# Patient Record
Sex: Female | Born: 1945 | Race: White | Hispanic: No | Smoking: Current every day smoker
Health system: Southern US, Community
[De-identification: ages and names within clinical notes are randomized; demographics above are authoritative.]

## PROBLEM LIST (undated history)

## (undated) DIAGNOSIS — E785 Hyperlipidemia, unspecified: Secondary | ICD-10-CM

## (undated) DIAGNOSIS — I10 Essential (primary) hypertension: Secondary | ICD-10-CM

## (undated) DIAGNOSIS — J45909 Unspecified asthma, uncomplicated: Secondary | ICD-10-CM

## (undated) DIAGNOSIS — R911 Solitary pulmonary nodule: Secondary | ICD-10-CM

## (undated) DIAGNOSIS — J439 Emphysema, unspecified: Secondary | ICD-10-CM

## (undated) DIAGNOSIS — R0600 Dyspnea, unspecified: Secondary | ICD-10-CM

## (undated) DIAGNOSIS — J449 Chronic obstructive pulmonary disease, unspecified: Secondary | ICD-10-CM

## (undated) DIAGNOSIS — R059 Cough, unspecified: Secondary | ICD-10-CM

## (undated) DIAGNOSIS — Z923 Personal history of irradiation: Secondary | ICD-10-CM

## (undated) DIAGNOSIS — J302 Other seasonal allergic rhinitis: Secondary | ICD-10-CM

## (undated) DIAGNOSIS — K219 Gastro-esophageal reflux disease without esophagitis: Secondary | ICD-10-CM

## (undated) DIAGNOSIS — L02619 Cutaneous abscess of unspecified foot: Secondary | ICD-10-CM

## (undated) HISTORY — PX: FOOT SURGERY: SHX648

## (undated) HISTORY — PX: ABDOMINAL HYSTERECTOMY: SHX81

---

## 2017-04-11 ENCOUNTER — Encounter: Payer: Self-pay | Admitting: Gastroenterology

## 2017-06-26 ENCOUNTER — Ambulatory Visit: Payer: Self-pay | Admitting: Gastroenterology

## 2017-10-26 ENCOUNTER — Encounter: Payer: Self-pay | Admitting: Nurse Practitioner

## 2018-02-01 ENCOUNTER — Ambulatory Visit: Payer: Self-pay | Admitting: Nurse Practitioner

## 2019-10-28 ENCOUNTER — Telehealth (HOSPITAL_COMMUNITY): Payer: Self-pay | Admitting: Adult Health

## 2019-10-28 NOTE — Telephone Encounter (Signed)
Called and LMOM regarding monoclonal antibody treatment for COVID 19 given to those who are at risk for complications and/or hospitalization of the virus.  Patient meets criteria based on: age  Call back number given: [phone]  My chart message: unable to send  Wilber Bihari, NP

## 2021-02-18 ENCOUNTER — Encounter: Payer: Self-pay | Admitting: Internal Medicine

## 2021-02-18 ENCOUNTER — Telehealth: Payer: Self-pay | Admitting: Pharmacy Technician

## 2021-02-18 ENCOUNTER — Other Ambulatory Visit: Payer: Self-pay

## 2021-02-18 ENCOUNTER — Ambulatory Visit (INDEPENDENT_AMBULATORY_CARE_PROVIDER_SITE_OTHER): Payer: Medicare Other | Admitting: Internal Medicine

## 2021-02-18 VITALS — BP 136/78 | HR 74 | Temp 98.2°F | Ht 60.0 in | Wt 146.0 lb

## 2021-02-18 DIAGNOSIS — J449 Chronic obstructive pulmonary disease, unspecified: Secondary | ICD-10-CM

## 2021-02-18 DIAGNOSIS — F17210 Nicotine dependence, cigarettes, uncomplicated: Secondary | ICD-10-CM

## 2021-02-18 DIAGNOSIS — R918 Other nonspecific abnormal finding of lung field: Secondary | ICD-10-CM

## 2021-02-18 DIAGNOSIS — R911 Solitary pulmonary nodule: Secondary | ICD-10-CM | POA: Insufficient documentation

## 2021-02-18 MED ORDER — BREZTRI AEROSPHERE 160-9-4.8 MCG/ACT IN AERO: 2.0000 | INHALATION_SPRAY | Freq: Two times a day (BID) | RESPIRATORY_TRACT | 0 refills | Status: DC

## 2021-02-18 MED ORDER — BREZTRI AEROSPHERE 160-9-4.8 MCG/ACT IN AERO: INHALATION_SPRAY | RESPIRATORY_TRACT | 11 refills | Status: DC

## 2021-02-18 NOTE — Assessment & Plan Note (Signed)
4-5 min discussion re active cigarette smoking in addition to office E&M  Ask about tobacco use:   ongoing Advise quitting   Discussed in terms of reducing risk of complications from any type of rx for lung ca as well as benefit   in airway disorders based on all the data we have from the multiple national lung health studies (perfomed over decades at millions of dollars in cost)  indicating that smoking cessation, not choice of inhalers or physicians, is the most important aspect of her care.   Assess willingness:  Not completely  committed at this point Assist in quit attempt:  Per PCP when ready Arrange follow up:   Follow up per Primary Care planned

## 2021-02-18 NOTE — H&P (View-Only) (Signed)
Misty Mann, female    DOB: 1945/02/16  MRN: [id number]   Brief patient profile:  27   yowf  active smoker  referred to pulmonary clinic in [place]  02/18/2021 by sister in law  for copd eval       History of Present Illness  02/18/2021  Pulmonary/ 1st office eval/ Nichola Warren / [facility]  Chief Complaint  Patient presents with   Consult    Consult for SOB and cough Dx with COPD and emphysema  Dyspnea:  steps or nl speed walking  = MMRC2 = can't walk a nl pace on a flat grade s sob but does fine slow and flat Cough: at times min dry daytime never bloody mucus  Sleep: flat bed/ 3 pillows  SABA use: sev times   No obvious day to day or daytime variability or assoc excess/ purulent sputum or mucus plugs or hemoptysis or cp or chest tightness, subjective wheeze or overt sinus or hb symptoms.   sleeping without nocturnal  or early am exacerbation  of respiratory  c/o's or need for noct saba. Also denies any obvious fluctuation of symptoms with weather or environmental changes or other aggravating or alleviating factors except as outlined above   No unusual exposure hx or h/o childhood pna/ asthma or knowledge of premature birth.  Current Allergies, Complete Past Medical History, Past Surgical History, Family History, and Social History were reviewed in [employer] record.  ROS  The following are not active complaints unless bolded Hoarseness, sore throat, dysphagia, dental problems, itching, sneezing,  nasal congestion or discharge of excess mucus or purulent secretions, ear ache,   fever, chills, sweats, unintended wt loss or wt gain, classically pleuritic or exertional cp,  orthopnea pnd or arm/hand swelling  or leg swelling, presyncope, palpitations, abdominal pain, anorexia, nausea, vomiting, diarrhea  or change in bowel habits or change in bladder habits, change in stools or change in urine, dysuria, hematuria,  rash, arthralgias, visual complaints, headache,  numbness, weakness or ataxia or problems with walking or coordination,  change in mood or  memory.           No past medical history on file.  Outpatient Medications Prior to Visit  Medication Sig Dispense Refill   ALBUTEROL IN Inhale into the lungs.     buPROPion (WELLBUTRIN XL) 300 MG 24 hr tablet Take 300 mg by mouth daily.     lisinopril (ZESTRIL) 20 MG tablet Take 20 mg by mouth daily.     montelukast (SINGULAIR) 10 MG tablet Take 10 mg by mouth at bedtime.     omeprazole (PRILOSEC) 20 MG capsule Take 20 mg by mouth daily.     QUEtiapine (SEROQUEL XR) 200 MG 24 hr tablet Take 200 mg by mouth at bedtime.     rosuvastatin (CRESTOR) 40 MG tablet Take 40 mg by mouth daily.     No facility-administered medications prior to visit.     Objective:     BP 136/78 (BP Location: Left Arm, Patient Position: Sitting)    Pulse 74    Temp 98.2 F (36.8 C) (Temporal)    Ht 5' (1.524 m)    Wt 146 lb (66.2 kg)    SpO2 96% Comment: ra   BMI 28.51 kg/m   SpO2: 96 % (ra)  Amb wf nad   HEENT : pt wearing mask not removed for exam due to covid -19 concerns.    NECK :  without JVD/Nodes/TM/ nl carotid upstrokes bilaterally  LUNGS: no acc muscle use,  Mod barrel  contour chest wall with bilateral  Distant exp  wheeze and  without cough on insp or exp maneuvers and mod  Hyperresonant  to  percussion bilaterally     CV:  RRR  no s3 or murmur or increase in P2, and no edema   ABD:  soft and nontender with pos mid insp Hoover's  in the supine position. No bruits or organomegaly appreciated, bowel sounds nl  MS:     ext warm without deformities, calf tenderness, cyanosis or clubbing No obvious joint restrictions   SKIN: warm and dry without lesions    NEURO:  alert, approp, nl sensorium with  no motor or cerebellar deficits apparent.             Assessment   COPD GOLD 3  / active smoking  Active smoker  - PFT's  09/2020 Sovah  FEV1  0.88 (48%)  and FVC 1.74 with ratio 51%  -  02/18/2021  After extensive coaching inhaler device,  effectiveness =    75% from a baseline of < 10% so try breztri 2bid  - Labs ordered 02/18/2021  :     alpha one AT phenotype   - 02/18/2021   Walked on RA  x  3  lap(s) =  approx 450  ft  @ moderate pace, stopped due to end of study s sob  with lowest 02 sats 94%     Group D in terms of symptom/risk and laba/lama/ICS  therefore appropriate rx at this point >>>  breztri trial   Advised:  formulary restrictions will be an ongoing challenge for the forseable future and I would be happy to pick an alternative if the pt will first  provide me a list of them -  pt  will need to return here for training for any new device that is required eg dpi vs hfa vs respimat.    In the meantime we can always provide samples so that the patient never runs out of any needed respiratory medications.      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 1st noted on LDCT  09/23/20  RUL  2.2 spiculated / cavitated  > POS PET  - [facility] turned down for excision "unless I would stop smoking first" (pt/fm takeaway message) - Quant GOLD TB 02/18/2021 >>>   - Super D CT 02/18/2021 >>>   I think she would be very high risk whether she stopped smoking or not at this point but will offer navigational bx and RT/ chemo if can't offer surgery once we know for sure what type of ca this is and what the prognosis / response to non -surgical rx looks like   Discussed in detail all the  indications, usual  risks and alternatives  relative to the benefits with patient who agrees to proceed with w/u as outlined.     Each maintenance medication was reviewed in detail including emphasizing most importantly the difference between maintenance and prns and under what circumstances the prns are to be triggered using an action plan format where appropriate.  Total time for H and P, chart review, counseling, reviewing hfa device(s) , directly observing portions of ambulatory 02 saturation study/ and generating  customized AVS unique to this first office visit / same day charting  > 45 min                Cigarette smoker 4-5 min discussion re active cigarette smoking in  addition to office E&M  Ask about tobacco use:   ongoing Advise quitting   Discussed in terms of reducing risk of complications from any type of rx for lung ca as well as benefit   in airway disorders based on all the data we have from the multiple national lung health studies (perfomed over decades at millions of dollars in cost)  indicating that smoking cessation, not choice of inhalers or physicians, is the most important aspect of her care.   Assess willingness:  Not completely  committed at this point Assist in quit attempt:  Per PCP when ready Arrange follow up:   Follow up per Primary Care planned            Christinia Gully, MD 02/18/2021

## 2021-02-18 NOTE — Telephone Encounter (Signed)
Patient Advocate Encounter  Received notification from [facility]) that prior authorization for BREZTRI 160MG  is required.   PA submitted on 2.2.23 Key  [id number] Status is pending   [facility] will continue to follow  Luciano Cutter, CPhT Patient Advocate Phone: [phone] Fax:  [phone]

## 2021-02-18 NOTE — Assessment & Plan Note (Addendum)
1st noted on LDCT  09/23/20  RUL  2.2 spiculated / cavitated  > POS PET  - [facility] turned down for excision "unless I would stop smoking first" (pt/fm takeaway message) - Quant GOLD TB 02/18/2021 >>>   - Super D CT 02/18/2021 >>>   I think she would be very high risk whether she stopped smoking or not at this point but will offer navigational bx and RT/ chemo if can't offer surgery once we know for sure what type of ca this is and what the prognosis / response to non -surgical rx looks like   Discussed in detail all the  indications, usual  risks and alternatives  relative to the benefits with patient who agrees to proceed with w/u as outlined.     Each maintenance medication was reviewed in detail including emphasizing most importantly the difference between maintenance and prns and under what circumstances the prns are to be triggered using an action plan format where appropriate.  Total time for H and P, chart review, counseling, reviewing hfa device(s) , directly observing portions of ambulatory 02 saturation study/ and generating customized AVS unique to this first office visit / same day charting  > 45 min

## 2021-02-18 NOTE — Patient Instructions (Signed)
Plan A = Automatic = Always=   Breztri Take 2 puffs first thing in am and then another 2 puffs about 12 hours later.    Work on inhaler technique:  relax and gently blow all the way out then take a nice smooth full deep breath back in, triggering the inhaler at same time you start breathing in.  Hold for up to 5 seconds if you can. Blow out thru nose. Rinse and gargle with water when done.  If mouth or throat bother you at all,  try brushing teeth/gums/tongue with arm and hammer toothpaste/ make a slurry and gargle and spit out.      Plan B = Backup (to supplement plan A, not to replace it) Only use your albuterol inhaler as a rescue medication to be used if you can't catch your breath by resting or doing a relaxed purse lip breathing pattern.  - The less you use it, the better it will work when you need it. - Ok to use the inhaler up to 2 puffs  every 4 hours if you must but call for appointment if use goes up over your usual need - Don't leave home without it !!  (think of it like the spare tire for your car)    We will schedule a Super CD CT and call to arrange a biopsy.  The key is to stop smoking completely before smoking completely stops you!   Please remember to go to the lab department   for your tests - we will call you with the results when they are available.     Pulmonary follow up can be arranged after your biopsy

## 2021-02-18 NOTE — Progress Notes (Signed)
Misty Mann, female    DOB: 1945/10/28  MRN: [id number]   Brief patient profile:  30   yowf  active smoker  referred to pulmonary clinic in [place]  02/18/2021 by sister in law  for copd eval       History of Present Illness  02/18/2021  Pulmonary/ 1st office eval/ Esvin Hnat / [facility]  Chief Complaint  Patient presents with   Consult    Consult for SOB and cough Dx with COPD and emphysema  Dyspnea:  steps or nl speed walking  = MMRC2 = can't walk a nl pace on a flat grade s sob but does fine slow and flat Cough: at times min dry daytime never bloody mucus  Sleep: flat bed/ 3 pillows  SABA use: sev times   No obvious day to day or daytime variability or assoc excess/ purulent sputum or mucus plugs or hemoptysis or cp or chest tightness, subjective wheeze or overt sinus or hb symptoms.   sleeping without nocturnal  or early am exacerbation  of respiratory  c/o's or need for noct saba. Also denies any obvious fluctuation of symptoms with weather or environmental changes or other aggravating or alleviating factors except as outlined above   No unusual exposure hx or h/o childhood pna/ asthma or knowledge of premature birth.  Current Allergies, Complete Past Medical History, Past Surgical History, Family History, and Social History were reviewed in [employer] record.  ROS  The following are not active complaints unless bolded Hoarseness, sore throat, dysphagia, dental problems, itching, sneezing,  nasal congestion or discharge of excess mucus or purulent secretions, ear ache,   fever, chills, sweats, unintended wt loss or wt gain, classically pleuritic or exertional cp,  orthopnea pnd or arm/hand swelling  or leg swelling, presyncope, palpitations, abdominal pain, anorexia, nausea, vomiting, diarrhea  or change in bowel habits or change in bladder habits, change in stools or change in urine, dysuria, hematuria,  rash, arthralgias, visual complaints, headache,  numbness, weakness or ataxia or problems with walking or coordination,  change in mood or  memory.           No past medical history on file.  Outpatient Medications Prior to Visit  Medication Sig Dispense Refill   ALBUTEROL IN Inhale into the lungs.     buPROPion (WELLBUTRIN XL) 300 MG 24 hr tablet Take 300 mg by mouth daily.     lisinopril (ZESTRIL) 20 MG tablet Take 20 mg by mouth daily.     montelukast (SINGULAIR) 10 MG tablet Take 10 mg by mouth at bedtime.     omeprazole (PRILOSEC) 20 MG capsule Take 20 mg by mouth daily.     QUEtiapine (SEROQUEL XR) 200 MG 24 hr tablet Take 200 mg by mouth at bedtime.     rosuvastatin (CRESTOR) 40 MG tablet Take 40 mg by mouth daily.     No facility-administered medications prior to visit.     Objective:     BP 136/78 (BP Location: Left Arm, Patient Position: Sitting)    Pulse 74    Temp 98.2 F (36.8 C) (Temporal)    Ht 5' (1.524 m)    Wt 146 lb (66.2 kg)    SpO2 96% Comment: ra   BMI 28.51 kg/m   SpO2: 96 % (ra)  Amb wf nad   HEENT : pt wearing mask not removed for exam due to covid -19 concerns.    NECK :  without JVD/Nodes/TM/ nl carotid upstrokes bilaterally  LUNGS: no acc muscle use,  Mod barrel  contour chest wall with bilateral  Distant exp  wheeze and  without cough on insp or exp maneuvers and mod  Hyperresonant  to  percussion bilaterally     CV:  RRR  no s3 or murmur or increase in P2, and no edema   ABD:  soft and nontender with pos mid insp Hoover's  in the supine position. No bruits or organomegaly appreciated, bowel sounds nl  MS:     ext warm without deformities, calf tenderness, cyanosis or clubbing No obvious joint restrictions   SKIN: warm and dry without lesions    NEURO:  alert, approp, nl sensorium with  no motor or cerebellar deficits apparent.             Assessment   COPD GOLD 3  / active smoking  Active smoker  - PFT's  09/2020 Sovah  FEV1  0.88 (48%)  and FVC 1.74 with ratio 51%  -  02/18/2021  After extensive coaching inhaler device,  effectiveness =    75% from a baseline of < 10% so try breztri 2bid  - Labs ordered 02/18/2021  :     alpha one AT phenotype   - 02/18/2021   Walked on RA  x  3  lap(s) =  approx 450  ft  @ moderate pace, stopped due to end of study s sob  with lowest 02 sats 94%     Group D in terms of symptom/risk and laba/lama/ICS  therefore appropriate rx at this point >>>  breztri trial   Advised:  formulary restrictions will be an ongoing challenge for the forseable future and I would be happy to pick an alternative if the pt will first  provide me a list of them -  pt  will need to return here for training for any new device that is required eg dpi vs hfa vs respimat.    In the meantime we can always provide samples so that the patient never runs out of any needed respiratory medications.      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 1st noted on LDCT  09/23/20  RUL  2.2 spiculated / cavitated  > POS PET  - [facility] turned down for excision "unless I would stop smoking first" (pt/fm takeaway message) - Quant GOLD TB 02/18/2021 >>>   - Super D CT 02/18/2021 >>>   I think she would be very high risk whether she stopped smoking or not at this point but will offer navigational bx and RT/ chemo if can't offer surgery once we know for sure what type of ca this is and what the prognosis / response to non -surgical rx looks like   Discussed in detail all the  indications, usual  risks and alternatives  relative to the benefits with patient who agrees to proceed with w/u as outlined.     Each maintenance medication was reviewed in detail including emphasizing most importantly the difference between maintenance and prns and under what circumstances the prns are to be triggered using an action plan format where appropriate.  Total time for H and P, chart review, counseling, reviewing hfa device(s) , directly observing portions of ambulatory 02 saturation study/ and generating  customized AVS unique to this first office visit / same day charting  > 45 min                Cigarette smoker 4-5 min discussion re active cigarette smoking in  addition to office E&M  Ask about tobacco use:   ongoing Advise quitting   Discussed in terms of reducing risk of complications from any type of rx for lung ca as well as benefit   in airway disorders based on all the data we have from the multiple national lung health studies (perfomed over decades at millions of dollars in cost)  indicating that smoking cessation, not choice of inhalers or physicians, is the most important aspect of her care.   Assess willingness:  Not completely  committed at this point Assist in quit attempt:  Per PCP when ready Arrange follow up:   Follow up per Primary Care planned            Christinia Gully, MD 02/18/2021

## 2021-02-18 NOTE — Assessment & Plan Note (Addendum)
Active smoker  - PFT's  09/2020 Sovah  FEV1  0.88 (48%)  and FVC 1.74 with ratio 51%  - 02/18/2021  After extensive coaching inhaler device,  effectiveness =    75% from a baseline of < 10% so try breztri 2bid  - Labs ordered 02/18/2021  :     alpha one AT phenotype   - 02/18/2021   Walked on RA  x  3  lap(s) =  approx 450  ft  @ moderate pace, stopped due to end of study s sob  with lowest 02 sats 94%     Group D in terms of symptom/risk and laba/lama/ICS  therefore appropriate rx at this point >>>  breztri trial   Advised:  formulary restrictions will be an ongoing challenge for the forseable future and I would be happy to pick an alternative if the pt will first  provide me a list of them -  pt  will need to return here for training for any new device that is required eg dpi vs hfa vs respimat.    In the meantime we can always provide samples so that the patient never runs out of any needed respiratory medications.

## 2021-02-19 ENCOUNTER — Other Ambulatory Visit (HOSPITAL_COMMUNITY): Payer: Self-pay

## 2021-02-19 ENCOUNTER — Telehealth: Payer: Self-pay | Admitting: Internal Medicine

## 2021-02-19 NOTE — Telephone Encounter (Signed)
Called and notified pts daughter tracey (ok per dpr) that PA was submitted yesterday. She voiced understanding. Offered a sample for patient and she states she will call if she needs one

## 2021-02-19 NOTE — Telephone Encounter (Signed)
"  Pt's daughter called stating her insurance won't cover Judithann Sauger without a prior [place].  Please advise.  Ph:  [phone], ins # [facility].  [facility] at [address] "  PA papers faxed to [facility] office. Will route to PA team

## 2021-02-23 ENCOUNTER — Telehealth: Payer: PRIVATE HEALTH INSURANCE | Admitting: Internal Medicine

## 2021-02-23 ENCOUNTER — Other Ambulatory Visit (HOSPITAL_COMMUNITY): Payer: Self-pay

## 2021-02-23 LAB — BASIC METABOLIC PANEL
BUN/Creatinine Ratio: 12 (ref 12–28)
BUN: 13 mg/dL (ref 8–27)
CO2: 25 mmol/L (ref 20–29)
Calcium: 9.6 mg/dL (ref 8.7–10.3)
Chloride: 101 mmol/L (ref 96–106)
Creatinine, Ser: 1.12 mg/dL — ABNORMAL HIGH (ref 0.57–1.00)
Glucose: 89 mg/dL (ref 70–99)
Potassium: 4.7 mmol/L (ref 3.5–5.2)
Sodium: 141 mmol/L (ref 134–144)
eGFR: 52 mL/min/{1.73_m2} — ABNORMAL LOW (ref >59)

## 2021-02-23 LAB — CBC WITH DIFFERENTIAL/PLATELET
Basophils Absolute: 0 10*3/uL (ref 0.0–0.2)
Basos: 0 %
EOS (ABSOLUTE): 0.2 10*3/uL (ref 0.0–0.4)
Eos: 3 %
Hematocrit: 42.6 % (ref 34.0–46.6)
Hemoglobin: 14.2 g/dL (ref 11.1–15.9)
Immature Grans (Abs): 0 10*3/uL (ref 0.0–0.1)
Immature Granulocytes: 0 %
Lymphocytes Absolute: 1.4 10*3/uL (ref 0.7–3.1)
Lymphs: 21 %
MCH: 29.5 pg (ref 26.6–33.0)
MCHC: 33.3 g/dL (ref 31.5–35.7)
MCV: 89 fL (ref 79–97)
Monocytes Absolute: 0.5 10*3/uL (ref 0.1–0.9)
Monocytes: 7 %
Neutrophils Absolute: 4.8 10*3/uL (ref 1.4–7.0)
Neutrophils: 69 %
Platelets: 275 10*3/uL (ref 150–450)
RBC: 4.81 x10E6/uL (ref 3.77–5.28)
RDW: 13.3 % (ref 11.7–15.4)
WBC: 6.9 10*3/uL (ref 3.4–10.8)

## 2021-02-23 LAB — QUANTIFERON-TB GOLD PLUS
QuantiFERON Mitogen Value: >10 IU/mL
QuantiFERON Nil Value: 0.02 IU/mL
QuantiFERON TB1 Ag Value: 0.02 IU/mL
QuantiFERON TB2 Ag Value: 0.03 IU/mL
QuantiFERON-TB Gold Plus: NEGATIVE

## 2021-02-23 LAB — ALPHA-1-ANTITRYPSIN PHENOTYP: A-1 Antitrypsin: 150 mg/dL (ref 101–187)

## 2021-02-23 MED ORDER — BREZTRI AEROSPHERE 160-9-4.8 MCG/ACT IN AERO: 2.0000 | INHALATION_SPRAY | Freq: Two times a day (BID) | RESPIRATORY_TRACT | 0 refills | Status: DC

## 2021-02-23 NOTE — Telephone Encounter (Signed)
Patient Advocate Encounter  Received notification from [facility] Medicare Part D that the request for prior authorization for Breztri 160 has been denied due to it being non-formulary.    PA Case ID: [id number]   Misty Mann is denied because it is not on your plan's Drug List (formulary). Medication authorization requires the following: (1) You need to try five (5) of these covered drugs: (a) Anoro Ellipta. (b) Breo Ellipta. (c) Serevent Diskus. (d) Spiriva HandiHaler. (e) Stiolto Respimat. (f) Trelegy Ellipta  Unfortunately all of these are expensive too. The pt has a high deductible to meet.   [facility] Patient Advocate Fax:  [phone]

## 2021-02-23 NOTE — Telephone Encounter (Signed)
Called patient but she did not answer. Left message for her to call back. Will need to speak with her before changing the prescription.

## 2021-02-23 NOTE — Telephone Encounter (Signed)
Called and spoke to tracey (ok per dpr) she would like samples for patient since they have not received an approval yet regarding breztri. Samples placed up front for pick up.   Will route to PA team for an update on PA for breztri. Please advise.  Thanks!

## 2021-02-23 NOTE — Telephone Encounter (Signed)
I have not called patient but did speak to her daugher Linus Orn earlier. Will return patients call and let her know about changes once we hear back from MD regarding new inhaler.

## 2021-02-23 NOTE — Telephone Encounter (Signed)
Try trelegy 103 one click each am

## 2021-02-23 NOTE — Telephone Encounter (Signed)
Pt called and left a vm fo me stating she had a missed call from this # (should be our main #?).  Looks like call from [employer].

## 2021-02-24 ENCOUNTER — Ambulatory Visit (HOSPITAL_COMMUNITY)
Admission: RE | Admit: 2021-02-24 | Discharge: 2021-02-24 | Disposition: A | Discharge status: Home / Self Care | Payer: Medicare Other | Source: Ambulatory Visit | Attending: Internal Medicine | Admitting: Internal Medicine

## 2021-02-24 ENCOUNTER — Other Ambulatory Visit: Payer: Self-pay

## 2021-02-24 DIAGNOSIS — R918 Other nonspecific abnormal finding of lung field: Secondary | ICD-10-CM | POA: Diagnosis present

## 2021-02-24 MED ORDER — TRELEGY ELLIPTA 100-62.5-25 MCG/ACT IN AEPB: 1.0000 | INHALATION_SPRAY | Freq: Every day | RESPIRATORY_TRACT | 11 refills | Status: DC

## 2021-02-24 NOTE — Telephone Encounter (Signed)
I approved trelegy 100 but she will need training on how to use it correctly on click each am

## 2021-02-24 NOTE — Telephone Encounter (Signed)
Spoke with the pt and notified of need for inhaler change. She was okay with the change. Rx sent. MAR updated. Nothing further needed.

## 2021-02-24 NOTE — Addendum Note (Signed)
Addended by: Rosana Berger on: 02/24/2021 09:24 AM   Modules accepted: Orders

## 2021-02-25 ENCOUNTER — Telehealth: Payer: Self-pay | Admitting: Internal Medicine

## 2021-02-25 NOTE — Telephone Encounter (Signed)
This was addressed by Magda Paganini to the patient yesterday in a sep. Encounter. Nothing further needed.

## 2021-02-26 ENCOUNTER — Other Ambulatory Visit: Payer: Self-pay | Admitting: Emergency Medicine

## 2021-02-26 DIAGNOSIS — R911 Solitary pulmonary nodule: Secondary | ICD-10-CM

## 2021-02-26 NOTE — Telephone Encounter (Signed)
Called and spoke with patient's daughter. She stated that the patient had received 2 calls from Dr. Melvyn Novas asking if she wanted a biopsy or surgery for her lung nodule. Then she said another call stated he was trying to get her scheduled for a biopsy on Monday 03/03/21. I advised her that I did not see any discussion in her chart.   Dr. Melvyn Novas, can you please advise? Thanks!

## 2021-02-26 NOTE — Telephone Encounter (Signed)
Documentation is in the result note.  I discussed the case with pt and Dr Lamonte Sakai and now also pt's daughter and all set for navigational tbbx when it can be mutually agreed to be performed.

## 2021-03-01 ENCOUNTER — Telehealth: Payer: Self-pay | Admitting: Emergency Medicine

## 2021-03-01 NOTE — Telephone Encounter (Signed)
I scheduled pt for 2/20 at 9:15 at [facility].  She will arrive at 6:15 to get her covid test that morning prior to procedure.  I spoke to pt & gave her appt info.  I spoke to Harrell at [facility] and she is going to have disc sent to [facility].

## 2021-03-03 ENCOUNTER — Other Ambulatory Visit: Payer: Self-pay

## 2021-03-05 ENCOUNTER — Encounter (HOSPITAL_COMMUNITY): Payer: Self-pay | Admitting: Emergency Medicine

## 2021-03-05 NOTE — Progress Notes (Signed)
Unable to reach patient via phone.  Left detailed message on machine with instructions for DOS.  DUE TO COVID-19 ONLY ONE VISITOR IS ALLOWED TO COME WITH YOU AND STAY IN THE WAITING ROOM ONLY DURING PRE OP AND PROCEDURE DAY OF SURGERY.   PCP -  Cardiologist - n/a Oncology - Dr Felicie Morn, [place] [place].  CT Chest x-ray - 02/24/21 EKG - DOS Stress Test - n/a ECHO - n/a Cardiac Cath - n/a  ICD Pacemaker/Loop - n/a  Sleep Study -  n/a CPAP - none  Anesthesia review: Yes  STOP now taking any Aspirin (unless otherwise instructed by your surgeon), Aleve, Naproxen, Ibuprofen, Motrin, Advil, Goody's, BC's, all herbal medications, fish oil, and all vitamins.   Coronavirus Screening Covid test is scheduled on DOS

## 2021-03-08 ENCOUNTER — Encounter (HOSPITAL_COMMUNITY)
Admission: RE | Disposition: A | Discharge status: Home / Self Care | Payer: Self-pay | Source: Home / Self Care | Attending: Emergency Medicine

## 2021-03-08 ENCOUNTER — Ambulatory Visit (HOSPITAL_BASED_OUTPATIENT_CLINIC_OR_DEPARTMENT_OTHER): Payer: Medicare Other | Admitting: Emergency Medicine

## 2021-03-08 ENCOUNTER — Ambulatory Visit (HOSPITAL_COMMUNITY): Payer: Medicare Other

## 2021-03-08 ENCOUNTER — Ambulatory Visit (HOSPITAL_COMMUNITY)
Admission: RE | Admit: 2021-03-08 | Discharge: 2021-03-08 | Disposition: A | Discharge status: Home / Self Care | Payer: Medicare Other | Attending: Emergency Medicine | Admitting: Emergency Medicine

## 2021-03-08 ENCOUNTER — Other Ambulatory Visit: Payer: Self-pay

## 2021-03-08 ENCOUNTER — Encounter (HOSPITAL_COMMUNITY): Payer: Self-pay | Admitting: Emergency Medicine

## 2021-03-08 ENCOUNTER — Ambulatory Visit (HOSPITAL_COMMUNITY): Payer: Medicare Other | Admitting: Emergency Medicine

## 2021-03-08 DIAGNOSIS — R911 Solitary pulmonary nodule: Secondary | ICD-10-CM | POA: Diagnosis present

## 2021-03-08 DIAGNOSIS — C3411 Malignant neoplasm of upper lobe, right bronchus or lung: Secondary | ICD-10-CM | POA: Diagnosis not present

## 2021-03-08 DIAGNOSIS — I10 Essential (primary) hypertension: Secondary | ICD-10-CM | POA: Insufficient documentation

## 2021-03-08 DIAGNOSIS — J449 Chronic obstructive pulmonary disease, unspecified: Secondary | ICD-10-CM | POA: Insufficient documentation

## 2021-03-08 DIAGNOSIS — F17200 Nicotine dependence, unspecified, uncomplicated: Secondary | ICD-10-CM | POA: Insufficient documentation

## 2021-03-08 DIAGNOSIS — Z20822 Contact with and (suspected) exposure to covid-19: Secondary | ICD-10-CM | POA: Diagnosis not present

## 2021-03-08 DIAGNOSIS — Z9889 Other specified postprocedural states: Secondary | ICD-10-CM

## 2021-03-08 DIAGNOSIS — K219 Gastro-esophageal reflux disease without esophagitis: Secondary | ICD-10-CM

## 2021-03-08 DIAGNOSIS — Z419 Encounter for procedure for purposes other than remedying health state, unspecified: Secondary | ICD-10-CM

## 2021-03-08 HISTORY — DX: Hyperlipidemia, unspecified: E78.5

## 2021-03-08 HISTORY — PX: VIDEO BRONCHOSCOPY WITH RADIAL ENDOBRONCHIAL ULTRASOUND: SHX6849

## 2021-03-08 HISTORY — DX: Other seasonal allergic rhinitis: J30.2

## 2021-03-08 HISTORY — DX: Solitary pulmonary nodule: R91.1

## 2021-03-08 HISTORY — DX: Emphysema, unspecified: J43.9

## 2021-03-08 HISTORY — PX: BRONCHIAL BIOPSY: SHX5109

## 2021-03-08 HISTORY — PX: BRONCHIAL NEEDLE ASPIRATION BIOPSY: SHX5106

## 2021-03-08 HISTORY — DX: Unspecified asthma, uncomplicated: J45.909

## 2021-03-08 HISTORY — DX: Dyspnea, unspecified: R06.00

## 2021-03-08 HISTORY — PX: BRONCHIAL BRUSHINGS: SHX5108

## 2021-03-08 HISTORY — DX: Chronic obstructive pulmonary disease, unspecified: J44.9

## 2021-03-08 HISTORY — PX: FIDUCIAL MARKER PLACEMENT: SHX6858

## 2021-03-08 HISTORY — DX: Gastro-esophageal reflux disease without esophagitis: K21.9

## 2021-03-08 HISTORY — DX: Essential (primary) hypertension: I10

## 2021-03-08 HISTORY — DX: Cough, unspecified: R05.9

## 2021-03-08 LAB — SARS CORONAVIRUS 2 BY RT PCR (HOSPITAL ORDER, PERFORMED IN ~~LOC~~ HOSPITAL LAB): SARS Coronavirus 2: NEGATIVE

## 2021-03-08 SURGERY — BRONCHOSCOPY, WITH BIOPSY USING ELECTROMAGNETIC NAVIGATION
Anesthesia: General

## 2021-03-08 MED ORDER — PROPOFOL 10 MG/ML IV BOLUS
INTRAVENOUS | Status: DC | PRN
  Administered 2021-03-08: 120 mg via INTRAVENOUS

## 2021-03-08 MED ORDER — EPHEDRINE SULFATE-NACL 50-0.9 MG/10ML-% IV SOSY
PREFILLED_SYRINGE | INTRAVENOUS | Status: DC | PRN
  Administered 2021-03-08: 5 mg via INTRAVENOUS

## 2021-03-08 MED ORDER — ONDANSETRON HCL 4 MG/2ML IJ SOLN
INTRAMUSCULAR | Status: DC | PRN
  Administered 2021-03-08: 4 mg via INTRAVENOUS

## 2021-03-08 MED ORDER — ROCURONIUM BROMIDE 10 MG/ML (PF) SYRINGE
PREFILLED_SYRINGE | INTRAVENOUS | Status: DC | PRN
  Administered 2021-03-08: 50 mg via INTRAVENOUS

## 2021-03-08 MED ORDER — CHLORHEXIDINE GLUCONATE 0.12 % MT SOLN
15.0000 mL | Freq: Once | OROMUCOSAL | Status: AC
  Filled 2021-03-08: qty 15

## 2021-03-08 MED ORDER — LACTATED RINGERS IV SOLN: INTRAVENOUS | Status: DC

## 2021-03-08 MED ORDER — SUGAMMADEX SODIUM 200 MG/2ML IV SOLN
INTRAVENOUS | Status: DC | PRN
Start: 2021-03-08 — End: 2021-03-08
  Administered 2021-03-08: 200 mg via INTRAVENOUS

## 2021-03-08 MED ORDER — FENTANYL CITRATE (PF) 100 MCG/2ML IJ SOLN: 25.0000 ug | INTRAMUSCULAR | Status: DC | PRN

## 2021-03-08 MED ORDER — PHENYLEPHRINE HCL-NACL 20-0.9 MG/250ML-% IV SOLN
INTRAVENOUS | Status: DC | PRN
  Administered 2021-03-08: 25 ug/min via INTRAVENOUS

## 2021-03-08 MED ORDER — CHLORHEXIDINE GLUCONATE 0.12 % MT SOLN
OROMUCOSAL | Status: AC
  Administered 2021-03-08: 15 mL via OROMUCOSAL
  Filled 2021-03-08: qty 15

## 2021-03-08 MED ORDER — ACETAMINOPHEN 500 MG PO TABS: 1000.0000 mg | ORAL_TABLET | Freq: Once | ORAL | Status: DC

## 2021-03-08 MED ORDER — ONDANSETRON HCL 4 MG/2ML IJ SOLN: 4.0000 mg | Freq: Once | INTRAMUSCULAR | Status: DC | PRN

## 2021-03-08 MED ORDER — OXYCODONE HCL 5 MG PO TABS: 5.0000 mg | ORAL_TABLET | Freq: Once | ORAL | Status: DC | PRN

## 2021-03-08 MED ORDER — LIDOCAINE 2% (20 MG/ML) 5 ML SYRINGE
INTRAMUSCULAR | Status: DC | PRN
Start: 2021-03-08 — End: 2021-03-08
  Administered 2021-03-08: 60 mg via INTRAVENOUS

## 2021-03-08 MED ORDER — OXYCODONE HCL 5 MG/5ML PO SOLN: 5.0000 mg | Freq: Once | ORAL | Status: DC | PRN

## 2021-03-08 MED ORDER — FENTANYL CITRATE (PF) 100 MCG/2ML IJ SOLN
INTRAMUSCULAR | Status: DC | PRN
  Administered 2021-03-08 (×2): 50 ug via INTRAVENOUS

## 2021-03-08 MED ORDER — DEXAMETHASONE SODIUM PHOSPHATE 10 MG/ML IJ SOLN
INTRAMUSCULAR | Status: DC | PRN
  Administered 2021-03-08: 5 mg via INTRAVENOUS

## 2021-03-08 SURGICAL SUPPLY — 1 items: fiducial ×1 IMPLANT

## 2021-03-08 NOTE — Anesthesia Preprocedure Evaluation (Addendum)
Anesthesia Evaluation  Patient identified by MRN, date of birth, ID band Patient awake    Reviewed: Allergy & Precautions, NPO status , Patient's Chart, lab work & pertinent test results  History of Anesthesia Complications Negative for: history of anesthetic complications  Airway Mallampati: III  TM Distance: >3 FB Neck ROM: Full  Mouth opening: Limited Mouth Opening  Dental  (+) Dental Advisory Given, Edentulous Upper, Partial Lower   Pulmonary asthma , COPD,  COPD inhaler, Current SmokerPatient did not abstain from smoking.,   RUL nodule    Pulmonary exam normal        Cardiovascular hypertension, Pt. on medications Normal cardiovascular exam     Neuro/Psych negative neurological ROS  negative psych ROS   GI/Hepatic Neg liver ROS, GERD  Controlled and Medicated,  Endo/Other  negative endocrine ROS  Renal/GU negative Renal ROS     Musculoskeletal negative musculoskeletal ROS (+)   Abdominal   Peds  Hematology negative hematology ROS (+)   Anesthesia Other Findings   Reproductive/Obstetrics                            Anesthesia Physical Anesthesia Plan  ASA: 3  Anesthesia Plan: General   Post-op Pain Management: Tylenol PO (pre-op)* and Minimal or no pain anticipated   Induction: Intravenous  PONV Risk Score and Plan: 3 and Treatment may vary due to age or medical condition, Ondansetron and Dexamethasone  Airway Management Planned: Oral ETT  Additional Equipment: None  Intra-op Plan:   Post-operative Plan: Extubation in OR  Informed Consent: I have reviewed the patients History and Physical, chart, labs and discussed the procedure including the risks, benefits and alternatives for the proposed anesthesia with the patient or authorized representative who has indicated his/her understanding and acceptance.     Dental advisory given  Plan Discussed with: CRNA and  Anesthesiologist  Anesthesia Plan Comments:        Anesthesia Quick Evaluation

## 2021-03-08 NOTE — Anesthesia Procedure Notes (Signed)
Procedure Name: Intubation Date/Time: 03/08/2021 9:55 AM Performed by: Reece Agar, CRNA Pre-anesthesia Checklist: Patient identified, Emergency Drugs available, Suction available and Patient being monitored Patient Re-evaluated:Patient Re-evaluated prior to induction Oxygen Delivery Method: Circle System Utilized Preoxygenation: Pre-oxygenation with 100% oxygen Induction Type: IV induction Ventilation: Mask ventilation without difficulty Laryngoscope Size: Mac and 3 Grade View: Grade I Tube type: Oral Tube size: 8.5 mm Number of attempts: 1 Airway Equipment and Method: Stylet and Oral airway Placement Confirmation: ETT inserted through vocal cords under direct vision, positive ETCO2 and breath sounds checked- equal and bilateral Secured at: 20 cm Tube secured with: Tape Dental Injury: Teeth and Oropharynx as per pre-operative assessment

## 2021-03-08 NOTE — Op Note (Signed)
Video Bronchoscopy with Robotic Assisted Bronchoscopic Navigation   Date of Operation: 03/08/2021   Pre-op Diagnosis: Right upper lobe nodule  Post-op Diagnosis: Same  Surgeon: Baltazar Apo  Assistants: None  Anesthesia: General endotracheal anesthesia  Operation: Flexible video fiberoptic bronchoscopy with robotic assistance and biopsies.  Estimated Blood Loss: Minimal  Complications: None  Indications and History: Misty Mann is a 76 y.o. female with history of tobacco use.  She was found to have a 2.6 cm right upper lobe pulmonary nodule on CT scan of the chest.  Recommendation made to achieve a tissue diagnosis via navigational bronchoscopy with biopsies. The risks, benefits, complications, treatment options and expected outcomes were discussed with the patient.  The possibilities of pneumothorax, pneumonia, reaction to medication, pulmonary aspiration, perforation of a viscus, bleeding, failure to diagnose a condition and creating a complication requiring transfusion or operation were discussed with the patient who freely signed the consent.    Description of Procedure: The patient was seen in the Preoperative Area, was examined and was deemed appropriate to proceed.  The patient was taken to [facility] endoscopy room 3, identified as Misty Mann and the procedure verified as Flexible Video Fiberoptic Bronchoscopy.  A Time Out was held and the above information confirmed.   Prior to the date of the procedure a high-resolution CT scan of the chest was performed. Utilizing ION software program a virtual tracheobronchial tree was generated to allow the creation of distinct navigation pathways to the patient's parenchymal abnormalities. After being taken to the operating room general anesthesia was initiated and the patient  was orally intubated. The video fiberoptic bronchoscope was introduced via the endotracheal tube and a general inspection was performed which showed normal right and left lung  anatomy, aspiration of the bilateral mainstems was completed to remove any remaining secretions. Robotic catheter inserted into patient's endotracheal tube.   Target #1 right upper lobe nodule: The distinct navigation pathways prepared prior to this procedure were then utilized to navigate to patient's lesion identified on CT scan. The robotic catheter was secured into place and the vision probe was withdrawn.  Lesion location was approximated using fluoroscopy and radial endobronchial ultrasound for peripheral targeting.  Local registration and targeting was performed using Cios three-dimensional imaging.  Under fluoroscopic guidance transbronchial needle brushings, transbronchial needle biopsies, and transbronchial forceps biopsies were performed to be sent for cytology and pathology.    At the end of the procedure a general airway inspection was performed and there was no evidence of active bleeding. The bronchoscope was removed.  The patient tolerated the procedure well. There was no significant blood loss and there were no obvious complications. A post-procedural chest x-ray is pending.  Samples Target #1: 1. Transbronchial needle brushings from right upper lobe nodule 2. Transbronchial Wang needle biopsies from right upper lobe nodule 3. Transbronchial forceps biopsies from right upper lobe nodule   Plans:  The patient will be discharged from the PACU to home when recovered from anesthesia and after chest x-ray is reviewed. We will review the cytology, pathology and microbiology results with the patient when they become available. Outpatient followup will be with Dr. Melvyn Novas.    Baltazar Apo, MD, PhD 03/08/2021, 10:47 AM  Pulmonary and Critical Care [phone] or if no answer before 7:00PM call [phone] For any issues after 7:00PM please call eLink [phone]

## 2021-03-08 NOTE — Transfer of Care (Signed)
Immediate Anesthesia Transfer of Care Note  Patient: Misty Mann  Procedure(s) Performed: ROBOTIC ASSISTED NAVIGATIONAL BRONCHOSCOPY BRONCHIAL BIOPSIES BRONCHIAL BRUSHINGS BRONCHIAL NEEDLE ASPIRATION BIOPSIES FIDUCIAL MARKER PLACEMENT  Patient Location: PACU  Anesthesia Type:General  Level of Consciousness: awake and alert   Airway & Oxygen Therapy: Patient Spontanous Breathing and Patient connected to face mask oxygen  Post-op Assessment: Report given to RN and Post -op Vital signs reviewed and stable  Post vital signs: Reviewed and stable  Last Vitals:  Vitals Value Taken Time  BP 169/83 03/08/21 1058  Temp    Pulse 83 03/08/21 1059  Resp 22 03/08/21 1059  SpO2 96 % 03/08/21 1059  Vitals shown include unvalidated device data.  Last Pain:  Vitals:   03/08/21 0706  TempSrc:   PainSc: 0-No pain         Complications: No notable events documented.

## 2021-03-08 NOTE — Interval H&P Note (Signed)
History and Physical Interval Note:  03/08/2021 7:29 AM  Misty Mann  has presented today for surgery, with the diagnosis of RIGHT UPPER LOPE MASS.  The various methods of treatment have been discussed with the patient and family. After consideration of risks, benefits and other options for treatment, the patient has consented to  Procedure(s): ROBOTIC ASSISTED NAVIGATIONAL BRONCHOSCOPY (N/A) as a surgical intervention.  The patient's history has been reviewed, patient examined, no change in status, stable for surgery.  I have reviewed the patient's chart and labs.  Questions were answered to the patient's satisfaction.     Collene Gobble

## 2021-03-08 NOTE — Anesthesia Postprocedure Evaluation (Signed)
Anesthesia Post Note  Patient: Theatre stage manager  Procedure(s) Performed: ROBOTIC ASSISTED NAVIGATIONAL BRONCHOSCOPY BRONCHIAL BIOPSIES BRONCHIAL BRUSHINGS BRONCHIAL NEEDLE ASPIRATION BIOPSIES FIDUCIAL MARKER PLACEMENT VIDEO BRONCHOSCOPY WITH RADIAL ENDOBRONCHIAL ULTRASOUND     Patient location during evaluation: PACU Anesthesia Type: General Level of consciousness: awake and alert Pain management: pain level controlled Vital Signs Assessment: post-procedure vital signs reviewed and stable Respiratory status: spontaneous breathing, nonlabored ventilation and respiratory function stable Cardiovascular status: stable and blood pressure returned to baseline Anesthetic complications: no   No notable events documented.  Last Vitals:  Vitals:   03/08/21 1130 03/08/21 1145  BP: 111/62 112/62  Pulse: 80 78  Resp: 10 18  Temp:  (!) 36.3 C  SpO2: 92% 92%    Last Pain:  Vitals:   03/08/21 1130  TempSrc:   PainSc: 0-No pain                 Audry Pili

## 2021-03-08 NOTE — Discharge Instructions (Signed)
Flexible Bronchoscopy, Care After This sheet gives you information about how to care for yourself after your test. Your doctor may also give you more specific instructions. If you have problems or questions, contact your doctor. Follow these instructions at home: Eating and drinking Do not eat or drink anything (not even water) for 2 hours after your test, or until your numbing medicine (local anesthetic) wears off. When your numbness is gone and your cough and gag reflexes have come back, you may: Eat only soft foods. Slowly drink liquids. The day after the test, go back to your normal diet. Driving Do not drive for 24 hours if you were given a medicine to help you relax (sedative). Do not drive or use heavy machinery while taking prescription pain medicine. General instructions  Take over-the-counter and prescription medicines only as told by your doctor. Return to your normal activities as told. Ask what activities are safe for you. Do not use any products that have nicotine or tobacco in them. This includes cigarettes and e-cigarettes. If you need help quitting, ask your doctor. Keep all follow-up visits as told by your doctor. This is important. It is very important if you had a tissue sample (biopsy) taken. Get help right away if: You have shortness of breath that gets worse. You get light-headed. You feel like you are going to pass out (faint). You have chest pain. You cough up: More than a little blood. More blood than before. Summary Do not eat or drink anything (not even water) for 2 hours after your test, or until your numbing medicine wears off. Do not use cigarettes. Do not use e-cigarettes. Get help right away if you have chest pain.  Please call our office for any questions or concerns.  [phone].  This information is not intended to replace advice given to you by your health care provider. Make sure you discuss any questions you have with your health care  provider. Document Released: 10/31/2008 Document Revised: 12/16/2016 Document Reviewed: 01/22/2016 Elsevier Patient Education  2020 [employer].

## 2021-03-10 ENCOUNTER — Encounter (HOSPITAL_COMMUNITY): Payer: Self-pay | Admitting: Emergency Medicine

## 2021-03-10 LAB — CYTOLOGY - NON PAP

## 2021-03-11 ENCOUNTER — Telehealth: Payer: Self-pay | Admitting: Emergency Medicine

## 2021-03-11 DIAGNOSIS — C3490 Malignant neoplasm of unspecified part of unspecified bronchus or lung: Secondary | ICD-10-CM

## 2021-03-11 NOTE — Telephone Encounter (Signed)
Called the patient and her daughter to review path results > shows squamous cell lung CA. No answer. Left message and will need to call them back.

## 2021-03-12 LAB — PULMONARY FUNCTION TEST ARMC ONLY

## 2021-03-12 NOTE — Telephone Encounter (Signed)
Spoke with the patient's daughter Misty Mann by phone.  Reviewed pathology results with her.  This may be stage I disease.  She would like to be referred to the cancer center at [facility] in [place] since they live in [place].  I will make this referral.  I will also order an MRI brain, PET scan.  We will try there is much of her testing in [place] as possible.

## 2021-03-16 ENCOUNTER — Telehealth: Payer: Self-pay | Admitting: Internal Medicine

## 2021-03-16 MED ORDER — BREZTRI AEROSPHERE 160-9-4.8 MCG/ACT IN AERO: 2.0000 | INHALATION_SPRAY | Freq: Two times a day (BID) | RESPIRATORY_TRACT | 0 refills | Status: DC

## 2021-03-16 NOTE — Telephone Encounter (Signed)
Sample provided to patient. Nothing further needed

## 2021-03-18 ENCOUNTER — Other Ambulatory Visit: Payer: Self-pay | Admitting: *Deleted

## 2021-03-18 NOTE — Progress Notes (Signed)
The proposed treatment discussed in cancer conference is for discussion purpose only and is not a binding recommendation. The patient was not physically examined nor present for their treatment options. Therefore, final treatment plans cannot be decided.  ?

## 2021-03-23 ENCOUNTER — Other Ambulatory Visit: Payer: Self-pay

## 2021-03-25 ENCOUNTER — Ambulatory Visit (HOSPITAL_COMMUNITY)
Admission: RE | Admit: 2021-03-25 | Discharge: 2021-03-25 | Disposition: A | Discharge status: Home / Self Care | Payer: Medicare Other | Source: Ambulatory Visit | Attending: Emergency Medicine | Admitting: Emergency Medicine

## 2021-03-25 ENCOUNTER — Other Ambulatory Visit: Payer: Self-pay

## 2021-03-25 DIAGNOSIS — C3490 Malignant neoplasm of unspecified part of unspecified bronchus or lung: Secondary | ICD-10-CM

## 2021-03-25 DIAGNOSIS — I2510 Atherosclerotic heart disease of native coronary artery without angina pectoris: Secondary | ICD-10-CM | POA: Diagnosis not present

## 2021-03-25 DIAGNOSIS — R911 Solitary pulmonary nodule: Secondary | ICD-10-CM | POA: Diagnosis not present

## 2021-03-25 DIAGNOSIS — K8020 Calculus of gallbladder without cholecystitis without obstruction: Secondary | ICD-10-CM | POA: Diagnosis not present

## 2021-03-25 DIAGNOSIS — I700 Atherosclerosis of aorta: Secondary | ICD-10-CM | POA: Diagnosis not present

## 2021-03-25 DIAGNOSIS — J432 Centrilobular emphysema: Secondary | ICD-10-CM | POA: Insufficient documentation

## 2021-03-25 MED ORDER — FLUDEOXYGLUCOSE F - 18 (FDG) INJECTION
7.5000 | Freq: Once | INTRAVENOUS | Status: AC | PRN
  Administered 2021-03-25: 08:00:00 7.5 via INTRAVENOUS

## 2021-03-31 ENCOUNTER — Other Ambulatory Visit: Payer: Self-pay

## 2021-03-31 ENCOUNTER — Ambulatory Visit (HOSPITAL_COMMUNITY)
Admission: RE | Admit: 2021-03-31 | Discharge: 2021-03-31 | Disposition: A | Discharge status: Home / Self Care | Payer: Medicare Other | Source: Ambulatory Visit | Attending: Emergency Medicine | Admitting: Emergency Medicine

## 2021-03-31 DIAGNOSIS — C3490 Malignant neoplasm of unspecified part of unspecified bronchus or lung: Secondary | ICD-10-CM | POA: Insufficient documentation

## 2021-03-31 MED ORDER — GADOBUTROL 1 MMOL/ML IV SOLN
7.0000 mL | Freq: Once | INTRAVENOUS | Status: AC | PRN
  Administered 2021-03-31: 7 mL via INTRAVENOUS

## 2021-04-04 DIAGNOSIS — C3491 Malignant neoplasm of unspecified part of right bronchus or lung: Secondary | ICD-10-CM | POA: Insufficient documentation

## 2021-04-05 ENCOUNTER — Inpatient Hospital Stay (HOSPITAL_COMMUNITY): Payer: Medicare Other | Attending: Hematology | Admitting: Hematology

## 2021-04-05 ENCOUNTER — Other Ambulatory Visit: Payer: Self-pay

## 2021-04-05 ENCOUNTER — Encounter (HOSPITAL_COMMUNITY): Payer: Self-pay | Admitting: Hematology

## 2021-04-05 DIAGNOSIS — Z800 Family history of malignant neoplasm of digestive organs: Secondary | ICD-10-CM | POA: Diagnosis not present

## 2021-04-05 DIAGNOSIS — E785 Hyperlipidemia, unspecified: Secondary | ICD-10-CM | POA: Diagnosis not present

## 2021-04-05 DIAGNOSIS — Z803 Family history of malignant neoplasm of breast: Secondary | ICD-10-CM | POA: Insufficient documentation

## 2021-04-05 DIAGNOSIS — C3411 Malignant neoplasm of upper lobe, right bronchus or lung: Secondary | ICD-10-CM | POA: Insufficient documentation

## 2021-04-05 DIAGNOSIS — I10 Essential (primary) hypertension: Secondary | ICD-10-CM | POA: Diagnosis not present

## 2021-04-05 DIAGNOSIS — Z79899 Other long term (current) drug therapy: Secondary | ICD-10-CM | POA: Diagnosis not present

## 2021-04-05 DIAGNOSIS — F17210 Nicotine dependence, cigarettes, uncomplicated: Secondary | ICD-10-CM | POA: Diagnosis not present

## 2021-04-05 DIAGNOSIS — C3491 Malignant neoplasm of unspecified part of right bronchus or lung: Secondary | ICD-10-CM

## 2021-04-05 DIAGNOSIS — Z801 Family history of malignant neoplasm of trachea, bronchus and lung: Secondary | ICD-10-CM | POA: Insufficient documentation

## 2021-04-05 NOTE — Patient Instructions (Addendum)
[place] at [facility] ?Discharge Instructions ? ?You were seen and examined today by Dr. Delton Coombes. Dr. Delton Coombes is a medical oncologist, meaning that he specializes in the treatment of cancer diagnoses. Dr. Delton Coombes discussed your past medical history, family history of cancers, and the events that led to you being here today. ? 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 This is a common type of lung cancer. ? ?The best course of treatment to cure the cancer is surgical removal. Dr. Delton Coombes has recommended that you meet with Dr. Roxan Hockey to discuss this further. ? ?If you are unable to undergo surgery, radiation therapy would be the next option. Dr. Delton Coombes will refer you to a Radiation Oncologist at [facility]. ? ?You should try Chantix to stop smoking and see Dr. Roxan Hockey. ? ?Follow-up with Dr. Delton Coombes in approximately 6 weeks. ? ? ?Thank you for choosing [place] at [facility] to provide your oncology and hematology care.  To afford each patient quality time with our provider, please arrive at least 15 minutes before your scheduled appointment time.  ? ?If you have a lab appointment with the [place] please come in thru the Main Entrance and check in at the main information desk. ? ?You need to re-schedule your appointment should you arrive 10 or more minutes late.  We strive to give you quality time with our providers, and arriving late affects you and other patients whose appointments are after yours.  Also, if you no show three or more times for appointments you may be dismissed from the clinic at the providers discretion.     ?Again, thank you for choosing [facility].  Our hope is that these requests will decrease the amount of time that you wait before being seen by our physicians.       ?_____________________________________________________________ ? ?Should you have questions  after your visit to [facility], please contact our office at [phone] and follow the prompts.  Our office hours are 8:00 a.m. and 4:30 p.m. Monday - Friday.  Please note that voicemails left after 4:00 p.m. may not be returned until the following business day.  We are closed weekends and major holidays.  You do have access to a nurse 24-7, just call the main number to the clinic [phone] and do not press any options, hold on the line and a nurse will answer the phone.   ? ?For prescription refill requests, have your pharmacy contact our office and allow 72 hours.   ? ?Due to Covid, you will need to wear a mask upon entering the hospital. If you do not have a mask, a mask will be given to you at the Main Entrance upon arrival. For doctor visits, patients may have 1 support person age 2 or older with them. For treatment visits, patients can not have anyone with them due to social distancing guidelines and our immunocompromised population.  ? ? ? ?

## 2021-04-05 NOTE — Progress Notes (Signed)
[facility] 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 76 y.o. female is here because of right squamous cell lung cancer, at the request of [facility].  Today she reports feeling good, and she is accompanied by her son. She denies recent weight loss fevers, night sweats, hemoptysis, and CP. She reports occasional chronic cough and SOB with exertion which she reports is typical for her with her smoking. She reports the last time she had surgery was in 1990. She denies history of CVA, MI, and cardiac issues. She is able to got to the grocery store and do her shopping with excess fatigue. She denies tingling/numbness. She reports occasional ankle swellings.   She currently lives at home on her own and is able to do her daily activities independently. Her son lives close by. She is smoking and has smoked 1 ppd for over 50 years. One sister had metastatic breast cancer, another sister, maternal uncle, and mother had lung cancer, another sister had colon cancer, and another maternal uncle had liver cancer.    MEDICAL HISTORY:  Past Medical History:  Diagnosis Date   Asthma    COPD (chronic obstructive pulmonary disease) (HCC)    Cough    dry cough   Dyspnea    Emphysema of lung (HCC)    GERD (gastroesophageal reflux disease)    HLD (hyperlipidemia)    Hypertension    Lung nodule    RUL - Seen on Lung CT   Seasonal allergies     SURGICAL HISTORY: Past Surgical History:  Procedure Laterality Date    ABDOMINAL HYSTERECTOMY     BRONCHIAL BIOPSY  03/08/2021   Procedure: BRONCHIAL BIOPSIES;  Surgeon: Leslye Peer, MD;  Location: [facility] ENDOSCOPY;  Service: Pulmonary;;   BRONCHIAL BRUSHINGS  03/08/2021   Procedure: BRONCHIAL BRUSHINGS;  Surgeon: Leslye Peer, MD;  Location: [facility] ENDOSCOPY;  Service: Pulmonary;;   BRONCHIAL NEEDLE ASPIRATION BIOPSY  03/08/2021   Procedure: BRONCHIAL NEEDLE ASPIRATION BIOPSIES;  Surgeon: Leslye Peer, MD;  Location: [facility] ENDOSCOPY;  Service: Pulmonary;;   FIDUCIAL MARKER PLACEMENT  03/08/2021   Procedure: FIDUCIAL MARKER PLACEMENT;  Surgeon: Leslye Peer, MD;  Location: [facility] ENDOSCOPY;  Service: Pulmonary;;   FOOT SURGERY Bilateral    VIDEO BRONCHOSCOPY WITH RADIAL ENDOBRONCHIAL ULTRASOUND  03/08/2021   Procedure: VIDEO BRONCHOSCOPY WITH RADIAL ENDOBRONCHIAL ULTRASOUND;  Surgeon: Leslye Peer, MD;  Location: [facility] ENDOSCOPY;  Service: Pulmonary;;    SOCIAL HISTORY: Social History   Socioeconomic History   Marital status: Widowed    Spouse name: Not on file   Number of children: Not on file   Years of education: Not on file   Highest education level: Not on file  Occupational History   Not on file  Tobacco Use   Smoking status: Every Day    Packs/day: 1.00    Types: Cigarettes   Smokeless tobacco: Never  Vaping Use   Vaping Use: Never used  Substance and Sexual Activity   Alcohol use: Never   Drug use: Never   Sexual activity: Not on file  Other  Topics Concern   Not on file  Social History Narrative   Not on file   Social Determinants of Health   Financial Resource Strain: Not on file  Food Insecurity: Not on file  Transportation Needs: Not on file  Physical Activity: Not on file  Stress: Not on file  Social Connections: Not on file  Intimate Partner Violence: Not on file    FAMILY HISTORY: History reviewed. No pertinent family history.  ALLERGIES:  has No Known Allergies.  MEDICATIONS:  Current Outpatient Medications  Medication  Sig Dispense Refill   albuterol (VENTOLIN HFA) 108 (90 Base) MCG/ACT inhaler Inhale 2 puffs into the lungs every 6 (six) hours as needed for wheezing or shortness of breath.     Budeson-Glycopyrrol-Formoterol (BREZTRI AEROSPHERE) 160-9-4.8 MCG/ACT AERO Inhale 2 puffs into the lungs in the morning and at bedtime. 10.7 g 0   Budeson-Glycopyrrol-Formoterol (BREZTRI AEROSPHERE) 160-9-4.8 MCG/ACT AERO Inhale 2 puffs into the lungs in the morning and at bedtime. 10.7 g 0   buPROPion (WELLBUTRIN XL) 300 MG 24 hr tablet Take 300 mg by mouth daily.     diphenhydrAMINE (BENADRYL) 25 MG tablet Take 25 mg by mouth every 6 (six) hours as needed for allergies.     hydrocortisone cream 1 % Apply 1 application topically 2 (two) times daily as needed (eczema).     lisinopril (ZESTRIL) 20 MG tablet Take 20 mg by mouth daily.     montelukast (SINGULAIR) 10 MG tablet Take 10 mg by mouth at bedtime.     naproxen sodium (ALEVE) 220 MG tablet Take 220 mg by mouth daily as needed (pain).     omeprazole (PRILOSEC) 20 MG capsule Take 20 mg by mouth daily.     PENICILLIN G SODIUM IJ Inject 1 Dose as directed every 30 (thirty) days.     QUEtiapine (SEROQUEL) 200 MG tablet Take 200 mg by mouth at bedtime.     rosuvastatin (CRESTOR) 40 MG tablet Take 40 mg by mouth daily.     Vitamin D, Ergocalciferol, (DRISDOL) 1.25 MG (50000 UNIT) CAPS capsule Take 50,000 Units by mouth every Sunday.     No current facility-administered medications for this visit.    REVIEW OF SYSTEMS:   Review of Systems  Constitutional:  Negative for appetite change, fatigue, fever and unexpected weight change.  Respiratory:  Positive for cough (stable) and shortness of breath (stable). Negative for hemoptysis.   Cardiovascular:  Positive for leg swelling (occasional - ankle). Negative for chest pain.  Endocrine: Negative for hot flashes.  Neurological:  Negative for numbness.  Psychiatric/Behavioral:  Positive for depression. The patient is  nervous/anxious.   All other systems reviewed and are negative.   PHYSICAL EXAMINATION: ECOG PERFORMANCE STATUS: 1 - Symptomatic but completely ambulatory  Vitals:   04/05/21 1313  BP: 137/74  Pulse: 74  Resp: 18  Temp: 97.6 F (36.4 C)  SpO2: 93%   Filed Weights   04/05/21 1313  Weight: 149 lb 1.6 oz (67.6 kg)   Physical Exam Vitals reviewed.  Constitutional:      Appearance: Normal appearance.  Cardiovascular:     Rate and Rhythm: Normal rate and regular rhythm.     Pulses: Normal pulses.     Heart sounds: Normal heart sounds.  Pulmonary:     Effort: Pulmonary effort is normal.     Breath sounds: Normal breath sounds.  Neurological:     General: No focal deficit present.     Mental Status: She is alert and  oriented to person, place, and time.  Psychiatric:        Mood and Affect: Mood normal.        Behavior: Behavior normal.     LABORATORY DATA:  I have reviewed the data as listed CBC Latest Ref Rng & Units 02/18/2021  WBC 3.4 - 10.8 x10E3/uL 6.9  Hemoglobin 11.1 - 15.9 g/dL 40.9  Hematocrit 81.1 - 46.6 % 42.6  Platelets 150 - 450 x10E3/uL 275   CMP Latest Ref Rng & Units 02/18/2021  Glucose 70 - 99 mg/dL 89  BUN 8 - 27 mg/dL 13  Creatinine 9.14 - 7.82 mg/dL 9.56(O)  Sodium 130 - 865 mmol/L 141  Potassium 3.5 - 5.2 mmol/L 4.7  Chloride 96 - 106 mmol/L 101  CO2 20 - 29 mmol/L 25  Calcium 8.7 - 10.3 mg/dL 9.6    RADIOGRAPHIC STUDIES: I have personally reviewed the radiological images as listed and agreed with the findings in the report. MR BRAIN W WO CONTRAST  Result Date: 04/01/2021 CLINICAL DATA:  Non-small cell lung cancer, staging EXAM: MRI HEAD WITHOUT AND WITH CONTRAST TECHNIQUE: Multiplanar, multiecho pulse sequences of the brain and surrounding structures were obtained without and with intravenous contrast. CONTRAST:  7mL GADAVIST GADOBUTROL 1 MMOL/ML IV SOLN COMPARISON:  None. FINDINGS: Brain: No restricted diffusion to suggest acute or subacute  infarct. No acute hemorrhage, mass, mass effect, or midline shift. No hydrocephalus or extra-axial collection. No abnormal parenchymal or meningeal enhancement. T2 hyperintense signal in the periventricular white matter, likely the sequela of moderate chronic small vessel ischemic disease. Vascular: Normal flow voids. Skull and upper cervical spine: Normal marrow signal. Sinuses/Orbits: Negative. Other: Trace fluid in bilateral mastoid air cells. IMPRESSION: No acute intracranial process. No evidence of metastatic disease in the brain. Electronically Signed   By: Wiliam Ke M.D.   On: 04/01/2021 03:38   NM PET Image Initial (PI) Skull Base To Thigh  Result Date: 03/25/2021 CLINICAL DATA:  Initial treatment strategy for non-small cell lung cancer. EXAM: NUCLEAR MEDICINE PET SKULL BASE TO THIGH TECHNIQUE: 7.5 mCi F-18 FDG was injected intravenously. Full-ring PET imaging was performed from the skull base to thigh after the radiotracer. CT data was obtained and used for attenuation correction and anatomic localization. Fasting blood glucose: 108 mg/dl COMPARISON:  Chest CT 78/46/9629 FINDINGS: Mediastinal blood pool activity: SUV max 2.1 Liver activity: SUV max NA NECK: No significant abnormal hypermetabolic activity in this region. Incidental CT findings: none CHEST: The cavitary 3.1 by 1.8 cm right upper lobe lung mass on image 17 of series 7 has a maximum SUV of 12.5, compatible with malignancy. The small subpleural nodularity posteriorly in the right upper lobe on image 12 series 7 is not appreciably hypermetabolic but 0.6 by 0.3 cm is below sensitive PET-CT size thresholds. Mildly accentuated activity in the distal esophagus, maximum SUV 3.9, technically nonspecific although physiologic activity can often occur in this region. Incidental CT findings: Airway thickening is present, suggesting bronchitis or reactive airways disease. Centrilobular emphysema. Coronary, aortic arch, and branch vessel  atherosclerotic vascular disease. Clustered small lower thoracic periaortic lymph nodes are not hypermetabolic. ABDOMEN/PELVIS: Faint bandlike density in the subcutaneous tissues adjacent to the upper margin of the right gluteus maximus muscle on image 180 series 3 with maximum SUV 3.2, likely inflammatory. Incidental CT findings: Cholelithiasis. Atherosclerosis is present, including aortoiliac atherosclerotic disease. Possible lipoma in the transverse duodenum, image 162 series 3. SKELETON: No significant abnormal hypermetabolic activity in this region. Incidental CT findings: none  IMPRESSION: 1. The right upper lobe cavitary mass has maximum SUV of 12.5, compatible with malignancy. No hypermetabolic adenopathy or compelling findings of distant metastatic spread. 2. Mild focal accentuated activity in the distal esophagus, maximum SUV 3.9, technically nonspecific although physiologic activity can occur in this region. 3. Other imaging findings of potential clinical significance: Airway thickening is present, suggesting bronchitis or reactive airways disease. Centrilobular emphysema. Aortic Atherosclerosis (ICD10-I70.0) and Emphysema (ICD10-J43.9). Coronary atherosclerosis. Cholelithiasis. Lipoma in the transverse duodenum. Likely inflammatory activity in the subcutaneous tissues along the upper margin of the right gluteus maximus muscle. Electronically Signed   By: Gaylyn Rong M.D.   On: 03/25/2021 15:38   DG Chest Port 1 View  Result Date: 03/08/2021 CLINICAL DATA:  A 76 year old female presents with RIGHT upper lobe lesion post bronchoscopy. EXAM: PORTABLE CHEST 1 VIEW COMPARISON:  Chest CT of February 24, 2021. FINDINGS: EKG leads project over the chest. Cardiomediastinal contours and hilar structures are normal. Added density at the RIGHT lung apex. Fiducial marker has been placed in the interval projecting over the RIGHT upper lobe. No signs of lobar consolidation. No signs of effusion or visible  pneumothorax. On limited assessment there is no acute skeletal process. IMPRESSION: Added density at the RIGHT lung apex post biopsy compatible with known pulmonary nodule now post fiducial marker placement and presumed biopsy. No acute cardiopulmonary disease. Electronically Signed   By: Donzetta Kohut M.D.   On: 03/08/2021 11:19   DG C-ARM BRONCHOSCOPY  Result Date: 03/08/2021 C-ARM BRONCHOSCOPY: Fluoroscopy was utilized by the requesting physician.  No radiographic interpretation.    ASSESSMENT:  Right squamous cell lung cancer: - CT chest lung cancer screening scan on 09/23/2020 with 2.2 cm spiculated nodule with central cavitation in the right upper lobe, adjacent 7 mm subpleural pulmonary nodule in the right upper lobe. - Bronchoscopy and FNA on 03/08/2021 by Dr. Delton Coombes of the right upper lobe mass consistent with squamous cell carcinoma.  IHC positive for CK5/6 and p63.  Negative for TTF-1. - PET scan on 03/25/2021 with right upper lobe cavitary mass, SUV 12.5, measures 3.1 x 1.8 cm.  No hypermetabolic adenopathy or distant metastatic disease. - MRI of the brain on 03/31/2021 was negative for metastatic disease.   Social/family history: - She lives at home by herself.  She is accompanied by her son who lives close by. - She is a current active smoker 1 pack/day x 50 plus years. - 3 sisters died of breast cancer, lung cancer and colon cancer respectively.  Mother had lung cancer.  Maternal uncle had lung cancer.  Another maternal uncle had liver cancer.   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 - I have discussed pathology report in detail. - Recommended smoking cessation.  We will send Chantix starter pack. - Recommend consultation with Dr. Dorris Fetch for possible surgical resection. - If she is found to be not surgical candidate, she will benefit from SBRT like treatment.  We will make referral to  radiation oncology. - RTC 6 weeks for follow-up.   All questions were answered. The patient knows to call the clinic with any problems, questions or concerns.   Doreatha Massed, MD, 04/05/21 2:09 PM  [facility] [phone]   I, Alda Ponder, am acting as a scribe for Dr. Doreatha Massed.  I, Doreatha Massed MD, have reviewed the above documentation for accuracy and completeness, and I  agree with the above.

## 2021-04-08 NOTE — Progress Notes (Signed)
Location of tumor and Histology per Pathology Report: right lung ? ?Biopsy:  ?A. LUNG, RUL, NEEDLE  BIOPSY:  ?- Malignant cells consistent with non-small cell carcinoma  ?- See comment  ? ?B. LUNG, RUL, BRUSHING:  ?- Malignant cells consistent with non-small cell carcinoma  ? ?Past/Anticipated interventions by surgeon, if any:   ?Surgeon: Baltazar Apo ?Operation: Flexible video fiberoptic bronchoscopy with robotic assistance and biopsies. ? 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 Roxan Hockey for possible surgical resection. ?- If she is found to be not surgical candidate, she will benefit from SBRT like treatment.  We will make referral to radiation oncology. ?- RTC 6 weeks for follow-up. ? ? ? ?Pain issues, if any:  no  ? ?SAFETY ISSUES: ?Prior radiation? no ?Pacemaker/ICD? no ?Possible current pregnancy? no ?Is the patient on methotrexate? no ? ?Current Complaints / other details:  occasional shortness of breath with exertion, productive cough with clear phlegm, denies hemoptysis. ?   ? ?Vitals:  ? 04/14/21 0924  ?BP: 129/66  ?Pulse: 72  ?Resp: 20  ?Temp: 97.9 ?F (36.6 ?C)  ?SpO2: 98%  ?Weight: 149 lb 4.8 oz (67.7 kg)  ?Height: 5' (1.524 m)  ? ? ? ?

## 2021-04-12 ENCOUNTER — Encounter: Payer: Medicare Other | Admitting: Thoracic Surgery (Cardiothoracic Vascular Surgery)

## 2021-04-13 NOTE — Progress Notes (Signed)
?Radiation Oncology         [phone] ?________________________________ ? ?Initial Outpatient Consultation ? ?Name: Misty Mann MRN: [id number]  ?Date: 04/14/2021  DOB: 1945/04/16 ? ?[id number], [name], MD  Derek Jack, MD  ? ?REFERRING PHYSICIAN: Derek Jack, MD ? ?DIAGNOSIS: The encounter diagnosis was Squamous cell lung cancer, right ([place]). ? ?Non-small cell carcinoma of the RUL ? ? Cancer Staging  ?Squamous cell lung cancer, right ([place]) ?Staging form: Lung, AJCC 8th Edition ?- Clinical stage from 04/04/2021: Stage IB (cT2a, cN0, cM0) - Unsigned ? ?HISTORY OF PRESENT ILLNESS::Misty Mann is a 76 y.o. female who is accompanied by accompanied by her son. she is seen as a courtesy of Dr. Delton Coombes for an opinion concerning radiation therapy as part of management for her recently diagnosed right lung cancer.  ? ?The patient first presented in September of 2022 with cough productive of clear sputum, worsening SOB on exertion, fatigue, wheezing, and loss of appetite. Around this time, she was evaluated by Dr. Macarthur Critchley and was found to have emphysema and bronchitis. Lung screening chest CT performed around this time on 09/23/20 incidentally showed a 2.2 cm spiculated nodule within the central RUL, as well as an adjacent 7 mm subpleural pulmonary nodule in the RUL. PET on 09/29/20 further revealed the spiculated right upper lobe as FDG avid and concerning for neoplastic disease.  ? ?Subsequently, the patient was referred to Dr. Wynelle Cleveland at the [facility] on 11/04/20 to discuss treatment options. Following evaluation, the patient was cleared to proceed with tissue dx. The patient also met with Dr. Melvyn Novas at [facility]monary care on 02/18/21 who recommended proceeding with bronchoscopy/biopsies, followed by RT and chemotherapy.  ? ?The patient was told she would need to stop smoking in order for her to have surgery.  Ultimately the patient was not felt to be a surgical candidate at that  time due to her ongoing smoking (she continues to smoke 1 pack of cigarettes per day) and apparently her pulmonary function studies were not optimal in addition for surgery. ? ?Chest CT on 02/24/21 ordered by Dr. Melvyn Novas demonstrated the irregular right upper lobe cavitary lung nodule as highly ?suspicious for primary bronchogenic carcinoma. No thoracic adenopathy was appreciated. Other nonspecific pulmonary nodules were also seen, including an adjacent right upper lobe 5 mm nodule, which was noted to possibly represent inferior extension of pleuroparenchymal scarring. ? ?Accordingly, the patient was referred to Dr. Lamonte Sakai, and proceeded to undergo bronchoscopy on 03/08/21. RUL biopsies collected revealed malignant cells consistent with non-small cell carcinoma. IHC positive for CK5/6 and p63.  Negative for TTF-1. ? ?The patient was recently referred to Dr. Delton Coombes on 04/05/21. During this visit, the patient reported feeling well other than occasional chronic cough and SOB on exertion (noted as typical for her due to smoking). Ultimately, Dr. Delton Coombes recommended consultation with Dr. Roxan Hockey for possible surgical resection. If she is not a surgical candidate, she was advised to proceed with SBRT. (She was also prescribed Chantix starter pack for smoking cessation).  ? ?Pertinent imaging thus far includes:  ?-- PET on 03/25/21 which demonstrated: the right upper lobe cavitary mass with a maximum SUV of 12.5 ?compatible with malignancy; mild non-specific focal accentuated activity in the distal esophagus with a maximum ?SUV of 3.9; and airway thickening possibly suggestive of bronchitis or reactive airway disease. No hypermetabolic adenopathy or compelling findings of distant metastatic spread were appreciated.  ?-- MRI of the brain w/ and w/out contrast on 03/31/21 showed no evidence of  intracranial metastatic disease.  ? ?Of note: the patient's case was discussed at the tumor board held on 03/18/21.   ? ?Patient has tried multiple times to stop smoking even with the assistance of Chantix.  She is unsuccessful in stopping smoking and continues to smoke a pack of cigarettes per day. ? ? ?PREVIOUS RADIATION THERAPY: No ? ?PAST MEDICAL HISTORY:  ?Past Medical History:  ?Diagnosis Date  ? Asthma   ? COPD (chronic obstructive pulmonary disease) ([place])   ? Cough   ? dry cough  ? Dyspnea   ? Emphysema of lung ([place])   ? GERD (gastroesophageal reflux disease)   ? HLD (hyperlipidemia)   ? Hypertension   ? Lung nodule   ? RUL - Seen on Lung CT  ? Seasonal allergies   ? ? ?PAST SURGICAL HISTORY: ?Past Surgical History:  ?Procedure Laterality Date  ? ABDOMINAL HYSTERECTOMY    ? BRONCHIAL BIOPSY  03/08/2021  ? Procedure: BRONCHIAL BIOPSIES;  Surgeon: Collene Gobble, MD;  Location: [facility] ENDOSCOPY;  Service: Pulmonary;;  ? BRONCHIAL BRUSHINGS  03/08/2021  ? Procedure: BRONCHIAL BRUSHINGS;  Surgeon: Collene Gobble, MD;  Location: [facility] ENDOSCOPY;  Service: Pulmonary;;  ? BRONCHIAL NEEDLE ASPIRATION BIOPSY  03/08/2021  ? Procedure: BRONCHIAL NEEDLE ASPIRATION BIOPSIES;  Surgeon: Collene Gobble, MD;  Location: [facility] ENDOSCOPY;  Service: Pulmonary;;  ? FIDUCIAL MARKER PLACEMENT  03/08/2021  ? Procedure: FIDUCIAL MARKER PLACEMENT;  Surgeon: Collene Gobble, MD;  Location: [facility] ENDOSCOPY;  Service: Pulmonary;;  ? FOOT SURGERY Bilateral   ? VIDEO BRONCHOSCOPY WITH RADIAL ENDOBRONCHIAL ULTRASOUND  03/08/2021  ? Procedure: VIDEO BRONCHOSCOPY WITH RADIAL ENDOBRONCHIAL ULTRASOUND;  Surgeon: Collene Gobble, MD;  Location: [facility] ENDOSCOPY;  Service: Pulmonary;;  ? ? ?FAMILY HISTORY: History reviewed. No pertinent family history. ? ?SOCIAL HISTORY:  ?Social History  ? ?Tobacco Use  ? Smoking status: Every Day  ?  Packs/day: 1.00  ?  Types: Cigarettes  ? Smokeless tobacco: Never  ?Vaping Use  ? Vaping Use: Never used  ?Substance Use Topics  ? Alcohol use: Never  ? Drug use: Never  ? ? ?ALLERGIES: No Known Allergies ? ?MEDICATIONS:  ?Current Outpatient  Medications  ?Medication Sig Dispense Refill  ? albuterol (VENTOLIN HFA) 108 (90 Base) MCG/ACT inhaler Inhale 2 puffs into the lungs every 6 (six) hours as needed for wheezing or shortness of breath.    ? Budeson-Glycopyrrol-Formoterol (BREZTRI AEROSPHERE) 160-9-4.8 MCG/ACT AERO Inhale 2 puffs into the lungs in the morning and at bedtime. 10.7 g 0  ? buPROPion (WELLBUTRIN XL) 300 MG 24 hr tablet Take 300 mg by mouth daily.    ? diphenhydrAMINE (BENADRYL) 25 MG tablet Take 25 mg by mouth every 6 (six) hours as needed for allergies.    ? hydrocortisone cream 1 % Apply 1 application topically 2 (two) times daily as needed (eczema).    ? lisinopril (ZESTRIL) 20 MG tablet Take 20 mg by mouth daily.    ? LORazepam (ATIVAN) 0.5 MG tablet Take 1 tablet (0.5 mg total) by mouth every 8 (eight) hours. Take 1 hour prior to radiation therapy 10 tablet 0  ? montelukast (SINGULAIR) 10 MG tablet Take 10 mg by mouth at bedtime.    ? naproxen sodium (ALEVE) 220 MG tablet Take 220 mg by mouth daily as needed (pain).    ? omeprazole (PRILOSEC) 20 MG capsule Take 20 mg by mouth daily.    ? PENICILLIN G SODIUM IJ Inject 1 Dose as directed every 30 (thirty) days.    ?  QUEtiapine (SEROQUEL) 200 MG tablet Take 200 mg by mouth at bedtime.    ? rosuvastatin (CRESTOR) 40 MG tablet Take 40 mg by mouth daily.    ? Vitamin D, Ergocalciferol, (DRISDOL) 1.25 MG (50000 UNIT) CAPS capsule Take 50,000 Units by mouth every Sunday.    ? ?No current facility-administered medications for this encounter.  ? ? ?REVIEW OF SYSTEMS:  A 10+ POINT REVIEW OF SYSTEMS WAS OBTAINED including neurology, dermatology, psychiatry, cardiac, respiratory, lymph, extremities, GI, GU, musculoskeletal, constitutional, reproductive, HEENT.  She denies any headaches or visual problems.  She denies any pain within the chest area,  significant cough or hemoptysis. ?  ?PHYSICAL EXAM:  height is 5' (1.524 m) and weight is 149 lb 4.8 oz (67.7 kg). Her temperature is 97.9 ?F (36.6  ?C). Her blood pressure is 129/66 and her pulse is 72. Her respiration is 20 and oxygen saturation is 98%.   ?General: Alert and oriented, in no acute distress ?HEENT: Head is normocephalic. Extraocular movements are in

## 2021-04-14 ENCOUNTER — Encounter: Payer: Medicare Other | Admitting: Thoracic Surgery (Cardiothoracic Vascular Surgery)

## 2021-04-14 ENCOUNTER — Ambulatory Visit
Admission: RE | Admit: 2021-04-14 | Discharge: 2021-04-14 | Disposition: A | Discharge status: Home / Self Care | Payer: Medicare Other | Source: Ambulatory Visit | Attending: Radiation Oncology | Admitting: Radiation Oncology

## 2021-04-14 ENCOUNTER — Other Ambulatory Visit: Payer: Self-pay

## 2021-04-14 ENCOUNTER — Encounter: Payer: Self-pay | Admitting: Radiation Oncology

## 2021-04-14 ENCOUNTER — Encounter: Payer: Self-pay | Admitting: *Deleted

## 2021-04-14 VITALS — BP 129/66 | HR 72 | Temp 97.9°F | Resp 20 | Ht 60.0 in | Wt 149.3 lb

## 2021-04-14 DIAGNOSIS — J45909 Unspecified asthma, uncomplicated: Secondary | ICD-10-CM | POA: Insufficient documentation

## 2021-04-14 DIAGNOSIS — K8020 Calculus of gallbladder without cholecystitis without obstruction: Secondary | ICD-10-CM | POA: Insufficient documentation

## 2021-04-14 DIAGNOSIS — Z79899 Other long term (current) drug therapy: Secondary | ICD-10-CM | POA: Diagnosis not present

## 2021-04-14 DIAGNOSIS — K219 Gastro-esophageal reflux disease without esophagitis: Secondary | ICD-10-CM | POA: Insufficient documentation

## 2021-04-14 DIAGNOSIS — I10 Essential (primary) hypertension: Secondary | ICD-10-CM | POA: Diagnosis not present

## 2021-04-14 DIAGNOSIS — E785 Hyperlipidemia, unspecified: Secondary | ICD-10-CM | POA: Insufficient documentation

## 2021-04-14 DIAGNOSIS — J432 Centrilobular emphysema: Secondary | ICD-10-CM | POA: Insufficient documentation

## 2021-04-14 DIAGNOSIS — I2510 Atherosclerotic heart disease of native coronary artery without angina pectoris: Secondary | ICD-10-CM | POA: Insufficient documentation

## 2021-04-14 DIAGNOSIS — C3491 Malignant neoplasm of unspecified part of right bronchus or lung: Secondary | ICD-10-CM

## 2021-04-14 DIAGNOSIS — C3411 Malignant neoplasm of upper lobe, right bronchus or lung: Secondary | ICD-10-CM | POA: Diagnosis not present

## 2021-04-14 DIAGNOSIS — F17210 Nicotine dependence, cigarettes, uncomplicated: Secondary | ICD-10-CM | POA: Insufficient documentation

## 2021-04-14 MED ORDER — LORAZEPAM 0.5 MG PO TABS: 0.5000 mg | ORAL_TABLET | Freq: Three times a day (TID) | ORAL | 0 refills | Status: AC

## 2021-04-14 NOTE — Progress Notes (Signed)
Oncology Nurse Navigator Documentation ? ? ?  04/14/2021  ? 12:00 PM  ?Oncology Nurse Navigator Flowsheets  ?Navigator Location [facility]  ?Navigator Encounter Type Other:  ?Barriers/Navigation Needs Coordination of Care/I received a call from Dr. Sondra Come. He updated me that patient would like radiation and not surgery. I notified Dr.  Leonarda Salon office to cancel appt with him.    ?Interventions Coordination of Care  ?Acuity Level 3-Moderate Needs (3-4 Barriers Identified)  ?Time Spent with Patient 30  ?  ?

## 2021-04-14 NOTE — Progress Notes (Signed)
See MD note for nursing evaluation. °

## 2021-04-15 ENCOUNTER — Encounter (HOSPITAL_COMMUNITY): Payer: Self-pay

## 2021-04-15 NOTE — Progress Notes (Signed)
Message received that patient has opted to pursue radiation opposed to surgery. Attempted to reach patient for navigation checkpoint, unable to reach patient at this time. Detailed VM left asking that the patient return my call should any needs arise. ?

## 2021-04-19 ENCOUNTER — Ambulatory Visit
Admission: RE | Admit: 2021-04-19 | Discharge: 2021-04-19 | Disposition: A | Discharge status: Home / Self Care | Payer: Medicare Other | Source: Ambulatory Visit | Attending: Radiation Oncology | Admitting: Radiation Oncology

## 2021-04-19 ENCOUNTER — Other Ambulatory Visit: Payer: Self-pay

## 2021-04-19 DIAGNOSIS — C3491 Malignant neoplasm of unspecified part of right bronchus or lung: Secondary | ICD-10-CM

## 2021-04-19 DIAGNOSIS — Z510 Encounter for antineoplastic radiation therapy: Secondary | ICD-10-CM | POA: Diagnosis not present

## 2021-04-19 DIAGNOSIS — C3411 Malignant neoplasm of upper lobe, right bronchus or lung: Secondary | ICD-10-CM | POA: Insufficient documentation

## 2021-04-20 DIAGNOSIS — Z510 Encounter for antineoplastic radiation therapy: Secondary | ICD-10-CM | POA: Diagnosis not present

## 2021-04-28 ENCOUNTER — Encounter: Payer: Medicare Other | Admitting: Thoracic Surgery (Cardiothoracic Vascular Surgery)

## 2021-05-04 ENCOUNTER — Ambulatory Visit
Admission: RE | Admit: 2021-05-04 | Discharge: 2021-05-04 | Disposition: A | Discharge status: Home / Self Care | Payer: Medicare Other | Source: Ambulatory Visit | Attending: Radiation Oncology | Admitting: Radiation Oncology

## 2021-05-04 ENCOUNTER — Other Ambulatory Visit: Payer: Self-pay

## 2021-05-04 ENCOUNTER — Telehealth: Payer: Self-pay | Admitting: Internal Medicine

## 2021-05-04 DIAGNOSIS — C3491 Malignant neoplasm of unspecified part of right bronchus or lung: Secondary | ICD-10-CM

## 2021-05-04 DIAGNOSIS — Z510 Encounter for antineoplastic radiation therapy: Secondary | ICD-10-CM | POA: Diagnosis not present

## 2021-05-04 LAB — RAD ONC ARIA SESSION SUMMARY
Course Elapsed Days: 0
Plan Fractions Treated to Date: 1
Plan Prescribed Dose Per Fraction: 18 Gy
Plan Total Fractions Prescribed: 3
Plan Total Prescribed Dose: 54 Gy
Reference Point Dosage Given to Date: 18 Gy
Reference Point Session Dosage Given: 18 Gy
Session Number: 1

## 2021-05-05 ENCOUNTER — Ambulatory Visit: Payer: Medicare Other

## 2021-05-05 MED ORDER — ANORO ELLIPTA 62.5-25 MCG/ACT IN AEPB: 1.0000 | INHALATION_SPRAY | Freq: Every day | RESPIRATORY_TRACT | 6 refills | Status: DC

## 2021-05-05 NOTE — Telephone Encounter (Signed)
pt daughter says that her mother has been receiving samples of inhalers (breztri & treolgy) but ins wont cover and its too expensive,but the daughter has found one that is in their budget(anoro-ellitta) & wants have this instead  ? ?Dr. Melvyn Novas please advise  ?

## 2021-05-05 NOTE — Telephone Encounter (Signed)
Ok to try anoro but will need f/u ov with all inhalers in hand to regroup ? ?

## 2021-05-05 NOTE — Telephone Encounter (Signed)
Called and spoke to [place] ok per dpr and she voiced understanding. Scheduled an appt for patient. New Rx sent in. Offered sample to patient but she declined. Nothing further needed.  ?

## 2021-05-06 ENCOUNTER — Other Ambulatory Visit: Payer: Self-pay

## 2021-05-06 ENCOUNTER — Ambulatory Visit
Admission: RE | Admit: 2021-05-06 | Discharge: 2021-05-06 | Disposition: A | Discharge status: Home / Self Care | Payer: Medicare Other | Source: Ambulatory Visit | Attending: Radiation Oncology | Admitting: Radiation Oncology

## 2021-05-06 ENCOUNTER — Telehealth: Payer: Self-pay | Admitting: Internal Medicine

## 2021-05-06 DIAGNOSIS — Z510 Encounter for antineoplastic radiation therapy: Secondary | ICD-10-CM | POA: Diagnosis not present

## 2021-05-06 DIAGNOSIS — C3491 Malignant neoplasm of unspecified part of right bronchus or lung: Secondary | ICD-10-CM

## 2021-05-06 LAB — RAD ONC ARIA SESSION SUMMARY
Course Elapsed Days: 2
Plan Fractions Treated to Date: 2
Plan Prescribed Dose Per Fraction: 18 Gy
Plan Total Fractions Prescribed: 3
Plan Total Prescribed Dose: 54 Gy
Reference Point Dosage Given to Date: 36 Gy
Reference Point Session Dosage Given: 18 Gy
Session Number: 2

## 2021-05-06 NOTE — Telephone Encounter (Signed)
ATC tracey to let her know samples are at front desk ready for pick up. VM is full  ?

## 2021-05-06 NOTE — Telephone Encounter (Signed)
Pt's daughter called back, made her aware samples are at the front desk.   ?

## 2021-05-11 ENCOUNTER — Ambulatory Visit
Admission: RE | Admit: 2021-05-11 | Discharge: 2021-05-11 | Disposition: A | Discharge status: Home / Self Care | Payer: Medicare Other | Source: Ambulatory Visit | Attending: Radiation Oncology | Admitting: Radiation Oncology

## 2021-05-11 ENCOUNTER — Encounter: Payer: Self-pay | Admitting: Radiation Oncology

## 2021-05-11 ENCOUNTER — Other Ambulatory Visit: Payer: Self-pay

## 2021-05-11 DIAGNOSIS — Z510 Encounter for antineoplastic radiation therapy: Secondary | ICD-10-CM | POA: Diagnosis not present

## 2021-05-11 LAB — RAD ONC ARIA SESSION SUMMARY
Course Elapsed Days: 7
Plan Fractions Treated to Date: 3
Plan Prescribed Dose Per Fraction: 18 Gy
Plan Total Fractions Prescribed: 3
Plan Total Prescribed Dose: 54 Gy
Reference Point Dosage Given to Date: 54 Gy
Reference Point Session Dosage Given: 18 Gy
Session Number: 3

## 2021-05-19 ENCOUNTER — Inpatient Hospital Stay (HOSPITAL_COMMUNITY): Payer: Medicare Other | Attending: Hematology | Admitting: Hematology

## 2021-05-26 ENCOUNTER — Telehealth: Payer: Self-pay | Admitting: *Deleted

## 2021-05-26 NOTE — Telephone Encounter (Signed)
CALLED PATIENT TO ASK ABOUT RESCHEDULING FU ON 06-17-21 DUE TO DR. KINARD BEING ON VACATION, SPOKE WITH PATIENT AND SHE AGREED TO COME ON 06-24-21 @ 10 AM ?

## 2021-05-31 ENCOUNTER — Telehealth: Payer: Self-pay

## 2021-05-31 NOTE — Telephone Encounter (Signed)
Received call from patients daughter stating that patient is having some left foot and ankle edema.  Denies any pain or redness to left lower extremity. Daughter was concerned that this could be caused by radiation. Encouraged patient to follow up with PCP. Daughter voiced understanding.  Confirmed radiation follow up appointment and time with daughter.  ?

## 2021-06-03 ENCOUNTER — Ambulatory Visit: Payer: Medicare Other | Admitting: Internal Medicine

## 2021-06-17 ENCOUNTER — Ambulatory Visit: Payer: Self-pay | Admitting: Radiation Oncology

## 2021-06-22 ENCOUNTER — Encounter: Payer: Self-pay | Admitting: Radiation Oncology

## 2021-06-22 NOTE — Progress Notes (Incomplete)
  Radiation Oncology         [phone] ________________________________  Patient Name: Misty Mann MRN: [id number] DOB: 30-Apr-1945 Referring Physician: Derek Jack Date of Service: 05/11/2021 [facility], [place]                                                        End Of Treatment Note  Diagnoses: C34.11-Malignant neoplasm of upper lobe, right bronchus or lung  Cancer Staging: The encounter diagnosis was Squamous cell lung cancer, right ([place]).   Non-small cell carcinoma of the RUL   Cancer Staging  Squamous cell lung cancer, right (HCC) Staging form: Lung, AJCC 8th Edition - Clinical stage from 04/04/2021: Stage IB (cT2a, cN0, cM0) - Unsigned  Intent: Curative  Radiation Treatment Dates: 05/04/2021 through 05/11/2021 Site Technique Total Dose (Gy) Dose per Fx (Gy) Completed Fx Beam Energies  Lung, Right: Lung_R IMRT 54/54 18 3/3 6XFFF   Narrative: The patient tolerated radiation therapy relatively well. During her final weekly treatment check on 05/06/21, the patient reported productive cough. She denied any other issues.   Plan: The patient will follow-up with radiation oncology in one month .  ________________________________________________ -----------------------------------  Blair Promise, PhD, MD  This document serves as a record of services personally performed by Gery Pray, MD. It was created on his behalf by Roney Mans, a trained medical scribe. The creation of this record is based on the scribe's personal observations and the provider's statements to them. This document has been checked and approved by the attending provider.

## 2021-06-23 NOTE — Progress Notes (Signed)
Radiation Oncology         [phone]) [phone] ________________________________  Name: Misty Mann MRN: [id number]  Date: 06/24/2021  DOB: [birth year]  Follow-Up Visit Note  CC: Earney Mallet, MD  Derek Jack, MD    ICD-10-CM   1. Squamous cell lung cancer, right (HCC)  C34.91 CT CHEST WO CONTRAST      Diagnosis: The encounter diagnosis was Squamous cell lung cancer, right ([place]).   Non-small cell carcinoma of the RUL   Cancer Staging  Squamous cell lung cancer, right (HCC) Staging form: Lung, AJCC 8th Edition - Clinical stage from 04/04/2021: Stage IB (cT2a, cN0, cM0) - Unsigned  Interval Since Last Radiation: 1 month and 2 weeks   Intent: Curative  Radiation Treatment Dates: 05/04/2021 through 05/11/2021 Site Technique Total Dose (Gy) Dose per Fx (Gy) Completed Fx Beam Energies  Lung, Right: Lung_R IMRT 54/54 18 3/3 6XFFF    Narrative:  The patient returns today for routine follow-up. The patient tolerated radiation therapy relatively well. During her final weekly treatment check on 05/06/21, the patient reported productive cough. She denied any other issues.           No significant interval history since the patient completed radiation therapy.   On evaluation today the patient denies any significant pain within the chest area significant cough or hemoptysis.                     Allergies:  has No Known Allergies.  Meds: Current Outpatient Medications  Medication Sig Dispense Refill   albuterol (VENTOLIN HFA) 108 (90 Base) MCG/ACT inhaler Inhale 2 puffs into the lungs every 6 (six) hours as needed for wheezing or shortness of breath.     buPROPion (WELLBUTRIN XL) 300 MG 24 hr tablet Take 300 mg by mouth daily.     diphenhydrAMINE (BENADRYL) 25 MG tablet Take 25 mg by mouth every 6 (six) hours as needed for allergies.     hydrocortisone cream 1 % Apply 1 application topically 2 (two) times daily as needed (eczema).     lisinopril (ZESTRIL) 20 MG tablet Take 20 mg by  mouth daily.     LORazepam (ATIVAN) 0.5 MG tablet Take 1 tablet (0.5 mg total) by mouth every 8 (eight) hours. Take 1 hour prior to radiation therapy 10 tablet 0   montelukast (SINGULAIR) 10 MG tablet Take 10 mg by mouth at bedtime.     naproxen sodium (ALEVE) 220 MG tablet Take 220 mg by mouth daily as needed (pain).     omeprazole (PRILOSEC) 20 MG capsule Take 20 mg by mouth daily.     PENICILLIN G SODIUM IJ Inject 1 Dose as directed every 30 (thirty) days.     QUEtiapine (SEROQUEL) 200 MG tablet Take 200 mg by mouth at bedtime.     rosuvastatin (CRESTOR) 40 MG tablet Take 40 mg by mouth daily.     umeclidinium-vilanterol (ANORO ELLIPTA) 62.5-25 MCG/ACT AEPB Inhale 1 puff into the lungs daily. 60 each 6   Vitamin D, Ergocalciferol, (DRISDOL) 1.25 MG (50000 UNIT) CAPS capsule Take 50,000 Units by mouth every Sunday.     No current facility-administered medications for this encounter.    Physical Findings: The patient is in no acute distress. Patient is alert and oriented.  weight is 152 lb (68.9 kg). Her blood pressure is 127/69 and her pulse is 65. Her respiration is 18 and oxygen saturation is 97%. .  No significant changes. Lungs are clear to auscultation bilaterally. Heart  has regular rate and rhythm. No palpable cervical, supraclavicular, or axillary adenopathy. Abdomen soft, non-tender, normal bowel sounds.    Lab Findings: Lab Results  Component Value Date   WBC 6.9 02/18/2021   HGB 14.2 02/18/2021   HCT 42.6 02/18/2021   MCV 89 02/18/2021   PLT 275 02/18/2021    Radiographic Findings: No results found.  Impression:  The encounter diagnosis was Squamous cell lung cancer, right ([place]).   Non-small cell carcinoma of the RUL  She tolerated her SBRT well .  She in addition at this time denies any residual effects from her radiation therapy.  Plan: She will be scheduled for a CT scan of the chest in 3 months.  I will see her a few days after this scan for examination and  review of her scan.  This scan is favorable then she will proceed with chest CT scan every 6 months afterward.    ____________________________________  Blair Promise, PhD, MD  This document serves as a record of services personally performed by Gery Pray, MD. It was created on his behalf by Roney Mans, a trained medical scribe. The creation of this record is based on the scribe's personal observations and the provider's statements to them. This document has been checked and approved by the attending provider.

## 2021-06-23 NOTE — Progress Notes (Incomplete)
Misty Mann is here today for follow up post radiation to the lung.  Lung Side: Right,patient completed treatment on 05/11/21  Does the patient complain of any of the following: Pain:*** Shortness of breath w/wo exertion: *** Cough: *** Hemoptysis: *** Pain with swallowing: *** Swallowing/choking concerns: *** Appetite: *** Energy Level: *** Post radiation skin Changes: ***    Additional comments if applicable:

## 2021-06-24 ENCOUNTER — Encounter: Payer: Self-pay | Admitting: Radiation Oncology

## 2021-06-24 ENCOUNTER — Telehealth: Payer: Self-pay | Admitting: Oncology

## 2021-06-24 ENCOUNTER — Ambulatory Visit
Admission: RE | Admit: 2021-06-24 | Discharge: 2021-06-24 | Disposition: A | Discharge status: Home / Self Care | Payer: Medicare Other | Source: Ambulatory Visit | Attending: Radiation Oncology | Admitting: Radiation Oncology

## 2021-06-24 ENCOUNTER — Other Ambulatory Visit: Payer: Self-pay

## 2021-06-24 VITALS — BP 127/69 | HR 65 | Resp 18 | Wt 152.0 lb

## 2021-06-24 DIAGNOSIS — C3411 Malignant neoplasm of upper lobe, right bronchus or lung: Secondary | ICD-10-CM | POA: Diagnosis present

## 2021-06-24 DIAGNOSIS — Z79899 Other long term (current) drug therapy: Secondary | ICD-10-CM | POA: Insufficient documentation

## 2021-06-24 DIAGNOSIS — Z923 Personal history of irradiation: Secondary | ICD-10-CM | POA: Insufficient documentation

## 2021-06-24 DIAGNOSIS — C3491 Malignant neoplasm of unspecified part of right bronchus or lung: Secondary | ICD-10-CM

## 2021-06-24 HISTORY — DX: Personal history of irradiation: Z92.3

## 2021-06-24 NOTE — Telephone Encounter (Signed)
Called Layne (son) and advised him that the referral to Dr. Delice Lesch should go through Maricel's primary care doctor's office.  Discussed if they are unable to order the referral to call us back.  He verbalized understanding and agreement and said Antonietta has an appointment with her PCP next week.

## 2021-06-24 NOTE — Progress Notes (Signed)
Misty Mann is here today for follow up post radiation to the lung.  Lung Side: Right,patient completed treatment on 05/11/21  Does the patient complain of any of the following: Pain:no Shortness of breath w/wo exertion: with activity Cough: some Hemoptysis: no Pain with swallowing: no Swallowing/choking concerns: no Appetite: good Energy Level: good Post radiation skin Changes: none Treatment 05/04/2021-05/11/2021 Vitals:   06/24/21 0950  BP: 127/69  Pulse: 65  Resp: 18  SpO2: 97%  Weight: 68.9 kg       Additional comments if applicable:

## 2021-07-08 ENCOUNTER — Ambulatory Visit (INDEPENDENT_AMBULATORY_CARE_PROVIDER_SITE_OTHER): Payer: Medicare Other | Admitting: Internal Medicine

## 2021-07-08 ENCOUNTER — Encounter: Payer: Self-pay | Admitting: Internal Medicine

## 2021-07-08 DIAGNOSIS — F17210 Nicotine dependence, cigarettes, uncomplicated: Secondary | ICD-10-CM

## 2021-07-08 DIAGNOSIS — I10 Essential (primary) hypertension: Secondary | ICD-10-CM | POA: Diagnosis not present

## 2021-07-08 DIAGNOSIS — R911 Solitary pulmonary nodule: Secondary | ICD-10-CM | POA: Diagnosis not present

## 2021-07-08 DIAGNOSIS — J449 Chronic obstructive pulmonary disease, unspecified: Secondary | ICD-10-CM

## 2021-07-08 MED ORDER — IRBESARTAN 150 MG PO TABS: 150.0000 mg | ORAL_TABLET | Freq: Every day | ORAL | 11 refills | Status: DC

## 2021-07-08 NOTE — Progress Notes (Unsigned)
Misty Mann, female    DOB: 16-Sep-1945  MRN: [id number]   Brief patient profile:  19   yowf  active smoker  referred to pulmonary clinic in [place]  02/18/2021 by sister in law  for copd eval     History of Present Illness  02/18/2021  Pulmonary/ 1st office eval/ Misty Mann / [facility]  Chief Complaint  Patient presents with   Consult    Consult for SOB and cough Dx with COPD and emphysema  Dyspnea:  steps or nl speed walking  = MMRC2 = can't walk a nl pace on a flat grade s sob but does fine slow and flat Cough: at times min dry daytime never bloody mucus  Sleep: flat bed/ 3 pillows  SABA use: sev times  Rec Plan A = Automatic = Always=   Breztri Take 2 puffs first thing in am and then another 2 puffs about 12 hours later.   Work on inhaler technique:   Plan B = Backup (to supplement plan A, not to replace it) Only use your albuterol inhaler as a rescue medication We will schedule a Super CD CT and call to arrange a biopsy. The key is to stop smoking completely before smoking completely stops you! Pulmonary follow up can be arranged after your biopsy> sq cell ca RT 05/04/2021 through 05/11/2021     07/08/2021  f/u ov/[facility]/Misty Mann re: GOLD 3  maint on anoro   Chief Complaint  Patient presents with   Follow-up    Breathing is about the same since last ov.  Using anoro inhaler  Dyspnea:  MMRC1 = can walk nl pace, flat grade, can't hurry or go uphills or steps s sob   Cough: none  Sleeping:  flat bed 3 pillows  SABA use: rarely  02: none  Covid status: vax ? 3      No obvious day to day or daytime variability or assoc excess/ purulent sputum or mucus plugs or hemoptysis or cp or chest tightness, subjective wheeze or overt sinus or hb symptoms.   *** without nocturnal  or early am exacerbation  of respiratory  c/o's or need for noct saba. Also denies any obvious fluctuation of symptoms with weather or environmental changes or other aggravating or alleviating factors  except as outlined above   No unusual exposure hx or h/o childhood pna/ asthma or knowledge of premature birth.  Current Allergies, Complete Past Medical History, Past Surgical History, Family History, and Social History were reviewed in [employer] record.  ROS  The following are not active complaints unless bolded Hoarseness, sore throat, dysphagia, dental problems, itching, sneezing,  nasal congestion or discharge of excess mucus or purulent secretions, ear ache,   fever, chills, sweats, unintended wt loss or wt gain, classically pleuritic or exertional cp,  orthopnea pnd or arm/hand swelling  or leg swelling, presyncope, palpitations, abdominal pain, anorexia, nausea, vomiting, diarrhea  or change in bowel habits or change in bladder habits, change in stools or change in urine, dysuria, hematuria,  rash, arthralgias, visual complaints, headache, numbness, weakness or ataxia or problems with walking or coordination,  change in mood or  memory.        Current Meds  Medication Sig   albuterol (VENTOLIN HFA) 108 (90 Base) MCG/ACT inhaler Inhale 2 puffs into the lungs every 6 (six) hours as needed for wheezing or shortness of breath.   buPROPion (WELLBUTRIN XL) 300 MG 24 hr tablet Take 300 mg by mouth daily.  diphenhydrAMINE (BENADRYL) 25 MG tablet Take 25 mg by mouth every 6 (six) hours as needed for allergies.   hydrocortisone cream 1 % Apply 1 application topically 2 (two) times daily as needed (eczema).   lisinopril (ZESTRIL) 20 MG tablet Take 20 mg by mouth daily.   LORazepam (ATIVAN) 0.5 MG tablet Take 1 tablet (0.5 mg total) by mouth every 8 (eight) hours. Take 1 hour prior to radiation therapy   montelukast (SINGULAIR) 10 MG tablet Take 10 mg by mouth at bedtime.   naproxen sodium (ALEVE) 220 MG tablet Take 220 mg by mouth daily as needed (pain).   omeprazole (PRILOSEC) 20 MG capsule Take 20 mg by mouth daily.   PENICILLIN G SODIUM IJ Inject 1 Dose as directed  every 30 (thirty) days.   QUEtiapine (SEROQUEL) 200 MG tablet Take 200 mg by mouth at bedtime.   rosuvastatin (CRESTOR) 40 MG tablet Take 40 mg by mouth daily.   umeclidinium-vilanterol (ANORO ELLIPTA) 62.5-25 MCG/ACT AEPB Inhale 1 puff into the lungs daily.   Vitamin D, Ergocalciferol, (DRISDOL) 1.25 MG (50000 UNIT) CAPS capsule Take 50,000 Units by mouth every Sunday.            Objective:     Wt Readings from Last 3 Encounters:  07/08/21 157 lb 6.4 oz (71.4 kg)  06/24/21 152 lb (68.9 kg)  04/14/21 149 lb 4.8 oz (67.7 kg)      Vital signs reviewed  07/08/2021  - Note at rest 02 sats  ***% on ***   General appearance:    amb wf/ raspy voice, mild pseudowheeze     HEENT :  Oropharynx  clear/Top denture/ lower partial      Nasal turbinates nl    NECK :  without JVD/Nodes/TM/ nl carotid upstrokes bilaterally   LUNGS: no acc muscle use,  Mod barrel  contour chest wall with bilateral  Distant bs s audible wheeze and  without cough on insp or exp maneuvers and mod  Hyperresonant  to  percussion bilaterally     CV:  RRR  no s3 or murmur or increase in P2, and no edema   ABD:  soft and nontender with pos mid insp Hoover's  in the supine position. No bruits or organomegaly appreciated, bowel sounds nl  MS:   Ext warm without deformities or   obvious joint restrictions , calf tenderness, cyanosis or clubbing  SKIN: warm and dry without lesions    NEURO:  alert, approp, nl sensorium with  no motor or cerebellar deficits apparent.                Assessment

## 2021-07-08 NOTE — Patient Instructions (Addendum)
Stop lisinopril  and start ibesartan 150 mg one daily in its place and call me if any problem   No other changes    The key is to stop smoking completely before smoking completely stops you!   Please schedule a follow up visit in 3 months but call sooner if needed

## 2021-07-08 NOTE — Assessment & Plan Note (Signed)
1st noted on LDCT  09/23/20  RUL  2.2 spiculated / cavitated  > POS PET  - [facility] turned down for excision "unless I would stop smoking first" (pt/fm takeaway message)   - Quant GOLD TB 02/18/2021 >>>  Neg  - Super D CT 02/18/2021 >>>  sq cell ca RT 05/04/2021 through 05/11/2021   Tolerated RT well

## 2021-07-08 NOTE — Assessment & Plan Note (Signed)
Active smoker/MM - PFT's  09/2020 [place]  FEV1  0.88 and FVC 1.74 with ratio 51%  - 02/18/2021  After extensive coaching inhaler device,  effectiveness =    75% from a baseline of < 10% so try breztri 2bid  - Labs ordered 02/18/2021  :     alpha one AT phenotype  MM  Level 150 - 02/18/2021   Walked on RA  x  3  lap(s) =  approx 450  ft  @ moderate pace, stopped due to end of study s sob  with lowest 02 sats 94%   - 02/18/2021 insurance changed to Anoro    Pt is Group B in terms of symptom/risk and laba/lama therefore appropriate rx at this point >>>  anoro adequate for now though raspy voice/ pseudowheeze developed since last ov on ACEi> try off acei first

## 2021-07-08 NOTE — Assessment & Plan Note (Signed)
Counseled re importance of smoking cessation but did not meet time criteria for separate billing            Each maintenance medication was reviewed in detail including emphasizing most importantly the difference between maintenance and prns and under what circumstances the prns are to be triggered using an action plan format where appropriate.  Total time for H and P, chart review, counseling, reviewing hfa/dpi  device(s) and generating customized AVS unique to this office visit / same day charting = 30 min

## 2021-07-09 ENCOUNTER — Encounter: Payer: Self-pay | Admitting: Internal Medicine

## 2021-09-24 ENCOUNTER — Ambulatory Visit (HOSPITAL_COMMUNITY)
Admission: RE | Admit: 2021-09-24 | Discharge: 2021-09-24 | Disposition: A | Discharge status: Home / Self Care | Payer: Medicare Other | Source: Ambulatory Visit | Attending: Radiation Oncology | Admitting: Radiation Oncology

## 2021-09-24 DIAGNOSIS — C3491 Malignant neoplasm of unspecified part of right bronchus or lung: Secondary | ICD-10-CM | POA: Diagnosis present

## 2021-09-30 ENCOUNTER — Telehealth: Payer: Self-pay | Admitting: *Deleted

## 2021-09-30 ENCOUNTER — Ambulatory Visit
Admission: RE | Admit: 2021-09-30 | Discharge: 2021-09-30 | Disposition: A | Discharge status: Home / Self Care | Payer: Medicare Other | Source: Ambulatory Visit | Attending: Radiation Oncology | Admitting: Radiation Oncology

## 2021-09-30 NOTE — Telephone Encounter (Signed)
Called patient to reschedule fu appt. for 09-30-21, unable to leave message, voice mail full

## 2021-10-01 ENCOUNTER — Telehealth: Payer: Self-pay | Admitting: *Deleted

## 2021-10-01 NOTE — Telephone Encounter (Signed)
Returned patient's daughter's phone call Evelena Asa), spoke with patient's daughter

## 2021-10-03 NOTE — Progress Notes (Signed)
Radiation Oncology         [phone] ________________________________  Name: Misty Mann MRN: [id number]  Date: 10/04/2021  DOB: 26-Apr-1945  Follow-Up Visit Note  CC: Earney Mallet, MD  Derek Jack, MD  No diagnosis found.  Diagnosis:  The encounter diagnosis was Squamous cell lung cancer, right ([place]).   Non-small cell carcinoma of the RUL   Cancer Staging  Squamous cell lung cancer, right (HCC) Staging form: Lung, AJCC 8th Edition - Clinical stage from 04/04/2021: Stage IB (cT2a, cN0, cM0) - Unsigned  Interval Since Last Radiation: 4 months and 24 days   Intent: Curative  Radiation Treatment Dates: 05/04/2021 through 05/11/2021 Site Technique Total Dose (Gy) Dose per Fx (Gy) Completed Fx Beam Energies  Lung, Right: Lung_R IMRT 54/54 18 3/3 6XFFF    Narrative:  The patient returns today for routine follow-up and to review recent imaging, she was last seen here for follow-up on 06/24/21. Since her last visit, the patient followed up with Dr. Melvyn Novas (pulmonology), on 07/08/21. During which time, the patient denied any new concerns and was started on ibesartan in place of lisinopril. (She was again counseled on smoking cessation).     Her most recent chest CT without contrast on 09/24/21 showed an interval decrease in size of the spiculated right upper lobe nodule, compatible with a good treatment response. No new signs of new or progressive disease were appreciated.   ***                            Allergies:  has No Known Allergies.  Meds: Current Outpatient Medications  Medication Sig Dispense Refill   albuterol (VENTOLIN HFA) 108 (90 Base) MCG/ACT inhaler Inhale 2 puffs into the lungs every 6 (six) hours as needed for wheezing or shortness of breath.     buPROPion (WELLBUTRIN XL) 300 MG 24 hr tablet Take 300 mg by mouth daily.     diphenhydrAMINE (BENADRYL) 25 MG tablet Take 25 mg by mouth every 6 (six) hours as needed for allergies.     hydrocortisone cream 1  % Apply 1 application topically 2 (two) times daily as needed (eczema).     irbesartan (AVAPRO) 150 MG tablet Take 1 tablet (150 mg total) by mouth daily. 30 tablet 11   lisinopril (ZESTRIL) 20 MG tablet Take 20 mg by mouth daily.     LORazepam (ATIVAN) 0.5 MG tablet Take 1 tablet (0.5 mg total) by mouth every 8 (eight) hours. Take 1 hour prior to radiation therapy 10 tablet 0   montelukast (SINGULAIR) 10 MG tablet Take 10 mg by mouth at bedtime.     naproxen sodium (ALEVE) 220 MG tablet Take 220 mg by mouth daily as needed (pain).     omeprazole (PRILOSEC) 20 MG capsule Take 20 mg by mouth daily.     PENICILLIN G SODIUM IJ Inject 1 Dose as directed every 30 (thirty) days.     QUEtiapine (SEROQUEL) 200 MG tablet Take 200 mg by mouth at bedtime.     rosuvastatin (CRESTOR) 40 MG tablet Take 40 mg by mouth daily.     umeclidinium-vilanterol (ANORO ELLIPTA) 62.5-25 MCG/ACT AEPB Inhale 1 puff into the lungs daily. 60 each 6   Vitamin D, Ergocalciferol, (DRISDOL) 1.25 MG (50000 UNIT) CAPS capsule Take 50,000 Units by mouth every Sunday.     No current facility-administered medications for this encounter.    Physical Findings: The patient is in no acute distress. Patient  is alert and oriented.  vitals were not taken for this visit. .  No significant changes. Lungs are clear to auscultation bilaterally. Heart has regular rate and rhythm. No palpable cervical, supraclavicular, or axillary adenopathy. Abdomen soft, non-tender, normal bowel sounds.   Lab Findings: Lab Results  Component Value Date   WBC 6.9 02/18/2021   HGB 14.2 02/18/2021   HCT 42.6 02/18/2021   MCV 89 02/18/2021   PLT 275 02/18/2021    Radiographic Findings: CT CHEST WO CONTRAST  Result Date: 09/26/2021 CLINICAL DATA:  Non-small cell lung cancer. Assess treatment response. EXAM: CT CHEST WITHOUT CONTRAST TECHNIQUE: Multidetector CT imaging of the chest was performed following the standard protocol without IV contrast.  RADIATION DOSE REDUCTION: This exam was performed according to the departmental dose-optimization program which includes automated exposure control, adjustment of the mA and/or kV according to patient size and/or use of iterative reconstruction technique. COMPARISON:  PET-CT 03/25/2021 FINDINGS: Cardiovascular: heart size appears within normal limits. No pericardial effusion. Aortic atherosclerosis and coronary artery calcifications. Mediastinum/Nodes: Normal appearance of the thyroid gland. The trachea appears patent and is midline. Normal appearance of the esophagus. No enlarged axillary or mediastinal lymph nodes. Hilar lymph nodes are suboptimally evaluated due to lack of IV contrast. Lungs/Pleura: No pleural effusion or airspace consolidation. Spiculated nodule within the periphery of the right upper lobe containing biopsy clips/fiducial marker measures 1.8 x 1.0 by 0.8 cm (volume = 0.8 cm^3), image 101/5 and image 32/7. On the previous exam this measured 2.7 x 1.6 by 1.5 cm (volume = 3.4 cm^3) Upper Abdomen: No acute abnormality Musculoskeletal: No chest wall mass or suspicious bone lesions identified. IMPRESSION: 1. Interval decrease in size of spiculated nodule within the periphery of the right upper lobe compatible with response to therapy. 2. No signs of new or progressive disease. 3. Aortic Atherosclerosis (ICD10-I70.0) and Emphysema (ICD10-J43.9). 4. Coronary artery calcifications. Electronically Signed   By: Kerby Moors M.D.   On: 09/26/2021 08:27    Impression:   The encounter diagnosis was Squamous cell lung cancer, right ([place]).   Non-small cell carcinoma of the RUL    The patient is recovering from the effects of radiation.  ***  Plan:  ***   *** minutes of total time was spent for this patient encounter, including preparation, face-to-face counseling with the patient and coordination of care, physical exam, and documentation of the  encounter. ____________________________________  Blair Promise, PhD, MD  This document serves as a record of services personally performed by Gery Pray, MD. It was created on his behalf by Roney Mans, a trained medical scribe. The creation of this record is based on the scribe's personal observations and the provider's statements to them. This document has been checked and approved by the attending provider.

## 2021-10-04 ENCOUNTER — Ambulatory Visit
Admission: RE | Admit: 2021-10-04 | Discharge: 2021-10-04 | Disposition: A | Discharge status: Home / Self Care | Payer: Medicare Other | Source: Ambulatory Visit | Attending: Radiation Oncology | Admitting: Radiation Oncology

## 2021-10-04 DIAGNOSIS — J439 Emphysema, unspecified: Secondary | ICD-10-CM | POA: Diagnosis not present

## 2021-10-04 DIAGNOSIS — C3491 Malignant neoplasm of unspecified part of right bronchus or lung: Secondary | ICD-10-CM

## 2021-10-04 DIAGNOSIS — I2510 Atherosclerotic heart disease of native coronary artery without angina pectoris: Secondary | ICD-10-CM | POA: Insufficient documentation

## 2021-10-04 DIAGNOSIS — Z79899 Other long term (current) drug therapy: Secondary | ICD-10-CM | POA: Insufficient documentation

## 2021-10-04 DIAGNOSIS — C3411 Malignant neoplasm of upper lobe, right bronchus or lung: Secondary | ICD-10-CM | POA: Diagnosis present

## 2021-10-04 DIAGNOSIS — Z923 Personal history of irradiation: Secondary | ICD-10-CM | POA: Diagnosis not present

## 2021-10-04 DIAGNOSIS — I700 Atherosclerosis of aorta: Secondary | ICD-10-CM | POA: Diagnosis not present

## 2021-10-04 NOTE — Progress Notes (Signed)
Misty Mann is here today for follow up post radiation to the lung.  Lung Side: Right  Does the patient complain of any of the following: Pain:no Shortness of breath w/wo exertion: yes when going up an down steps Cough: no Hemoptysis: no Pain with swallowing: no Swallowing/choking concerns: no Appetite: good Energy Level: fair Post radiation skin Changes: no    Additional comments if applicable: None noted.  BP (!) 143/69 (BP Location: Right Arm, Patient Position: Sitting, Cuff Size: Normal)   Pulse 69   Temp 97.6 F (36.4 C) (Oral)   Resp 17   Ht 5' (1.524 m)   Wt 194 lb 6.4 oz (88.2 kg)   SpO2 95%   BMI 37.97 kg/m

## 2021-10-10 NOTE — Progress Notes (Signed)
Misty Mann, female    DOB: [birth year]  MRN: [id number]   Brief patient profile:  74  yowf  active smoker/MM  referred to pulmonary clinic in [place]  02/18/2021 by sister in law  for copd eval     History of Present Illness  02/18/2021  Pulmonary/ 1st office eval/ Jared Whorley / [place] [facility]  Chief Complaint  Patient presents with   Consult    Consult for SOB and cough Dx with COPD and emphysema  Dyspnea:  steps or nl speed walking  = MMRC2 = can't walk a nl pace on a flat grade s sob but does fine slow and flat Cough: at times min dry daytime never bloody mucus  Sleep: flat bed/ 3 pillows  SABA use: sev times  Rec Plan A = Automatic = Always=   Breztri Take 2 puffs first thing in am and then another 2 puffs about 12 hours later.   Work on inhaler technique:   Plan B = Backup (to supplement plan A, not to replace it) Only use your albuterol inhaler as a rescue medication We will schedule a Super CD CT and call to arrange a biopsy. The key is to stop smoking completely before smoking completely stops you! 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  Using anoro inhaler  Dyspnea:  MMRC1 = can walk nl pace, flat grade, can't hurry or go uphills or steps s sob   Cough: freq throat clearing daytime Sleeping:  flat bed 3 pillows  SABA use: rarely  02: none  Covid status: vax ? 3  Rec Stop lisinopril  and start ibesartan 150 mg one daily in its place and call me if any problem  No other changes   The key is to stop smoking completely before smoking completely stops you!    10/11/2021  f/u ov/[place] office/Brezlyn Manrique re: GOLD 3  maint on anoro     Chief Complaint  Patient presents with   Follow-up    Feels breathing may be a little worse since last ov.     Dyspnea:  worse than usual with steps  Cough: dry sporadic no obvious trigger. Can be severe  Sleeping: no resp cc flat bed 3 pillows SABA use:  none  02: none       No obvious day to day or daytime variability or assoc excess/ purulent sputum or mucus plugs or hemoptysis or cp or chest tightness, subjective wheeze or overt sinus or hb symptoms.   Sleeping as above without nocturnal  or early am exacerbation  of respiratory  c/o's or need for noct saba. Also denies any obvious fluctuation of symptoms with weather or environmental changes or other aggravating or alleviating factors except as outlined above   No unusual exposure hx or h/o childhood pna/ asthma or knowledge of premature birth.  Current Allergies, Complete Past Medical History, Past Surgical History, Family History, and Social History were reviewed in [employer] record.  ROS  The following are not active complaints unless bolded Hoarseness, sore throat, dysphagia, dental problems, itching, sneezing,  nasal congestion or discharge of excess mucus or purulent secretions, ear ache,   fever, chills, sweats, unintended wt loss or wt gain, classically pleuritic or  exertional cp,  orthopnea pnd or arm/hand swelling  or leg swelling, presyncope, palpitations, abdominal pain, anorexia, nausea, vomiting, diarrhea  or change in bowel habits or change in bladder habits, change in stools or change in urine, dysuria, hematuria,  rash, arthralgias, visual complaints, headache, numbness, weakness or ataxia or problems with walking or coordination,  change in mood or  memory.        Current Meds  Medication Sig   albuterol (VENTOLIN HFA) 108 (90 Base) MCG/ACT inhaler Inhale 2 puffs into the lungs every 6 (six) hours as needed for wheezing or shortness of breath.   buPROPion (WELLBUTRIN XL) 300 MG 24 hr tablet Take 300 mg by mouth daily.   diphenhydrAMINE (BENADRYL) 25 MG tablet Take 25 mg by mouth every 6 (six) hours as  needed for allergies.   hydrocortisone cream 1 % Apply 1 application topically 2 (two) times daily as needed (eczema).   irbesartan (AVAPRO) 150 MG tablet Take 1 tablet (150 mg total) by mouth daily.   lisinopril (ZESTRIL) 20 MG tablet Take 20 mg by mouth daily.   LORazepam (ATIVAN) 0.5 MG tablet Take 1 tablet (0.5 mg total) by mouth every 8 (eight) hours. Take 1 hour prior to radiation therapy   montelukast (SINGULAIR) 10 MG tablet Take 10 mg by mouth at bedtime.   naproxen sodium (ALEVE) 220 MG tablet Take 220 mg by mouth daily as needed (pain).   omeprazole (PRILOSEC) 20 MG capsule Take 20 mg by mouth daily.   PENICILLIN G SODIUM IJ Inject 1 Dose as directed every 30 (thirty) days.   QUEtiapine (SEROQUEL) 200 MG tablet Take 200 mg by mouth at bedtime.   rosuvastatin (CRESTOR) 40 MG tablet Take 40 mg by mouth daily.   umeclidinium-vilanterol (ANORO ELLIPTA) 62.5-25 MCG/ACT AEPB Inhale 1 puff into the lungs daily.   Vitamin D, Ergocalciferol, (DRISDOL) 1.25 MG (50000 UNIT) CAPS capsule Take 50,000 Units by mouth every Sunday.                Objective:     10/11/2021       15 3   07/08/21 157 lb 6.4 oz (71.4 kg)  06/24/21 152 lb (68.9 kg)  04/14/21 149 lb 4.8 oz (67.7 kg)     Vital signs reviewed  10/11/2021  - Note at rest 02 sats  98% on RA   General appearance:    pleasant elderly amb wf nad     HEENT :  Oropharynx  clear  top denture/ lower partial   NECK :  without JVD/Nodes/TM/ nl carotid upstrokes bilaterally   LUNGS: no acc muscle use,  Mod barrel  contour chest wall with bilateral  Distant bs s audible wheeze and  without cough on insp or exp maneuvers and mod  Hyperresonant  to  percussion bilaterally     CV:  RRR  no s3 or murmur or increase in P2, and no edema   ABD:  soft and nontender with pos mid insp Hoover's  in the supine position. No bruits or organomegaly appreciated, bowel sounds nl  MS:   Ext warm without deformities or   obvious joint restrictions ,  calf tenderness, cyanosis or clubbing  SKIN: warm and dry without lesions    NEURO:  alert, approp, nl sensorium with  no motor or cerebellar deficits apparent.                  I personally reviewed images and agree with radiology impression as follows:  Chest CT s contrast  09/24/21 1. Interval decrease in size of spiculated nodule within the periphery of the right upper lobe compatible with response to therapy. 2. No signs of new or progressive disease. 3. Aortic Atherosclerosis (ICD10-I70.0) and Emphysema (ICD10-J43.9). 4. Coronary artery calcifications.        Assessment

## 2021-10-11 ENCOUNTER — Ambulatory Visit (INDEPENDENT_AMBULATORY_CARE_PROVIDER_SITE_OTHER): Payer: Medicare Other | Admitting: Internal Medicine

## 2021-10-11 ENCOUNTER — Telehealth: Payer: Self-pay | Admitting: Internal Medicine

## 2021-10-11 ENCOUNTER — Encounter: Payer: Self-pay | Admitting: Internal Medicine

## 2021-10-11 VITALS — BP 138/84 | HR 78 | Temp 98.1°F | Ht 60.0 in | Wt 153.0 lb

## 2021-10-11 DIAGNOSIS — R911 Solitary pulmonary nodule: Secondary | ICD-10-CM | POA: Diagnosis not present

## 2021-10-11 DIAGNOSIS — F17210 Nicotine dependence, cigarettes, uncomplicated: Secondary | ICD-10-CM | POA: Diagnosis not present

## 2021-10-11 DIAGNOSIS — Z23 Encounter for immunization: Secondary | ICD-10-CM | POA: Diagnosis not present

## 2021-10-11 DIAGNOSIS — J449 Chronic obstructive pulmonary disease, unspecified: Secondary | ICD-10-CM | POA: Diagnosis not present

## 2021-10-11 DIAGNOSIS — I10 Essential (primary) hypertension: Secondary | ICD-10-CM | POA: Diagnosis not present

## 2021-10-11 MED ORDER — IRBESARTAN 150 MG PO TABS: 150.0000 mg | ORAL_TABLET | Freq: Every day | ORAL | 11 refills | Status: DC

## 2021-10-11 NOTE — Telephone Encounter (Signed)
Called and spoke to patients son layne and he states patient has been off of lisinopril and has  been taking irbesartan for a while but not sure exactly how long. Layne wants to know if patient should keep follow up appt for 6 weeks or have an appt further out

## 2021-10-11 NOTE — Telephone Encounter (Signed)
Stay on ibesartan and off lisinopril  Ov in 6 weeks to regroup as may need a different inhaler if lisinopril is not the culpritt - on return need to bring in formulary if possible for cheapest alternative

## 2021-10-11 NOTE — Assessment & Plan Note (Signed)
Counseled re importance of smoking cessation but did not meet time criteria for separate billing   °

## 2021-10-11 NOTE — Patient Instructions (Addendum)
Be sure to stop lisinopril  and start ibesartan 150 mg one daily in its place and call me if any problem  ( if already on both then just take the ibesartan twice daily but stop the lisinopril)   No other changes    The key is to stop smoking completely before smoking completely stops you!   Please schedule a follow up office visit in 6 weeks, call sooner if needed Please schedule a follow up office visit in 6 weeks, call sooner if needed with all medications /inhalers/ solutions in hand so we can verify exactly what you are taking. This includes all medications from all doctors and over the counters   Addendum: Actually only on ibesartan 150 mg daily

## 2021-10-11 NOTE — Assessment & Plan Note (Signed)
Active smoker/MM - PFT's  09/2020 [place]  FEV1  0.88 and FVC 1.74 with ratio 51%  - 02/18/2021  After extensive coaching inhaler device,  effectiveness =    75% from a baseline of < 10% so try breztri 2bid  - Labs ordered 02/18/2021  :     alpha one AT phenotype  MM  Level 150 - 02/18/2021   Walked on RA  x  3  lap(s) =  approx 450  ft  @ moderate pace, stopped due to end of study s sob  with lowest 02 sats 94%   - 02/18/2021 insurance changed to Anoro   Pt is Group B in terms of symptom/risk and laba/lama therefore appropriate rx at this point >>>  Ok to continue anoro and approp saba

## 2021-10-11 NOTE — Assessment & Plan Note (Addendum)
Change ACEi to arb 07/08/2021 due to pw and raspy voice (on anoro also)    Still somewhat raspy on anoro and ARB ; however, Although even in retrospect it may not be clear the ACEi contributed to the pt's symptoms,    adding them back at this point or in the future would risk confusion in interpretation of non-specific respiratory symptoms to which this patient is prone  ie  Better not to muddy the waters here.   >>> continue anoro /ibesartan 150 mg daily for now and f/u in 3 m, sooner if needed, with formulary review on return if upper airway symptoms problematic on anoro eg bevespi or stiolto options           Each maintenance medication was reviewed in detail including emphasizing most importantly the difference between maintenance and prns and under what circumstances the prns are to be triggered using an action plan format where appropriate.  Total time for H and P, chart review, counseling, reviewing hfa/dpi device(s) and generating customized AVS unique to this office visit / same day charting  > 30 min

## 2021-10-11 NOTE — Assessment & Plan Note (Signed)
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

## 2021-10-11 NOTE — Telephone Encounter (Signed)
Called and spoke to patients son Misty Mann (ok per dpr) and gave him response from Dr. Melvyn Novas. He expressed understanding. Nothing further needed at this time

## 2021-10-18 ENCOUNTER — Ambulatory Visit: Payer: Self-pay | Admitting: Radiation Oncology

## 2021-11-23 ENCOUNTER — Ambulatory Visit: Payer: Medicare Other | Admitting: Internal Medicine

## 2021-12-23 ENCOUNTER — Ambulatory Visit (INDEPENDENT_AMBULATORY_CARE_PROVIDER_SITE_OTHER): Payer: Medicare Other | Admitting: Internal Medicine

## 2021-12-23 ENCOUNTER — Encounter: Payer: Self-pay | Admitting: Internal Medicine

## 2021-12-23 VITALS — BP 122/70 | HR 84 | Temp 98.2°F | Ht 60.0 in | Wt 154.0 lb

## 2021-12-23 DIAGNOSIS — F17210 Nicotine dependence, cigarettes, uncomplicated: Secondary | ICD-10-CM

## 2021-12-23 DIAGNOSIS — J449 Chronic obstructive pulmonary disease, unspecified: Secondary | ICD-10-CM | POA: Diagnosis not present

## 2021-12-23 NOTE — Assessment & Plan Note (Signed)
Active smoker/MM - PFT's  09/2020 [facility]  FEV1  0.88 and FVC 1.74 with ratio 51%  - 02/18/2021  After extensive coaching inhaler device,  effectiveness =    75% from a baseline of < 10% so try breztri 2bid  - Labs ordered 02/18/2021  :     alpha one AT phenotype  MM  Level 150 - 02/18/2021   Walked on RA  x  3  lap(s) =  approx 450  ft  @ moderate pace, stopped due to end of study s sob  with lowest 02 sats 94%   - 02/18/2021 insurance changed to Anoro  - 12/23/2021  After extensive coaching inhaler device,  effectiveness =    50% baseline 25% hfa   Pt is Group B in terms of symptom/risk and laba/lama therefore appropriate rx at this point >>>  anoro approp as long as cough not main concern plus more approp use of saba  Re SABA :  I spent extra time with pt today reviewing appropriate use of albuterol for prn use on exertion with the following points: 1) saba is for relief of sob that does not improve by walking a slower pace or resting but rather if the pt does not improve after trying this first. 2) If the pt is convinced, as many are, that saba helps recover from activity faster then it's easy to tell if this is the case by re-challenging : ie stop, take the inhaler, then p 5 minutes try the exact same activity (intensity of workload) that just caused the symptoms and see if they are substantially diminished or not after saba 3) if there is an activity that reproducibly causes the symptoms, try the saba 15 min before the activity on alternate days   If in fact the saba really does help, then fine to continue to use it prn but advised may need to look closer at the maintenance regimen being used to achieve better control of airways disease with exertion.

## 2021-12-23 NOTE — Progress Notes (Signed)
Misty Mann, female    DOB: 1946/01/05  MRN: [id number]   Brief patient profile:  25  yowf  active smoker/MM  referred to pulmonary clinic in [place]  02/18/2021 by sister in law  for copd eval     History of Present Illness  02/18/2021  Pulmonary/ 1st office eval/ Misty Mann / [place] [facility]  Chief Complaint  Patient presents with   Consult    Consult for SOB and cough Dx with COPD and emphysema  Dyspnea:  steps or nl speed walking  = MMRC2 = can't walk a nl pace on a flat grade s sob but does fine slow and flat Cough: at times min dry daytime never bloody mucus  Sleep: flat bed/ 3 pillows  SABA use: sev times  Rec Plan A = Automatic = Always=   Breztri Take 2 puffs first thing in am and then another 2 puffs about 12 hours later.   Work on inhaler technique:   Plan B = Backup (to supplement plan A, not to replace it) Only use your albuterol inhaler as a rescue medication We will schedule a Super CD CT and call to arrange a biopsy. The key is to stop smoking completely before smoking completely stops you! 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  Using anoro inhaler  Dyspnea:  MMRC1 = can walk nl pace, flat grade, can't hurry or go uphills or steps s sob   Cough: freq throat clearing daytime Sleeping:  flat bed 3 pillows  SABA use: rarely  02: none  Covid status: vax ? 3  Rec Stop lisinopril  and start ibesartan 150 mg one daily in its place and call me if any problem  No other changes   The key is to stop smoking completely before smoking completely stops you!    10/11/2021  f/u ov/[place] office/Misty Mann re: GOLD 3  maint on anoro     Chief Complaint  Patient presents with   Follow-up    Feels breathing may be a little worse since last ov.     Dyspnea:  worse than usual with steps  Cough: dry sporadic no obvious trigger. Can be severe  Sleeping: no resp cc flat bed 3 pillows SABA use:  none  02: none  Rec Be sure to stop lisinopril  and start ibesartan 150 mg one daily in its place and call me if any problem  ( if already on both then just take the ibesartan twice daily but stop the lisinopril)  No other changes   The key is to stop smoking completely before smoking completely stops you! Please schedule a follow up office visit in 6 weeks, call sooner if needed Please schedule a follow up office visit in 6 weeks, call sooner if needed with all medications /inhalers/ solutions in hand so we can verify exactly what you are taking. This includes all medications from all doctors and over the counters   Addendum: Actually only on ibesartan 150 mg daily    12/23/2021  f/u ov/[place] office/Misty Mann re: GOLD 3 copd  maint on Anoro  Chief Complaint  Patient presents with   Follow-up    Breathing about the same since last ov  Dyspnea:  MMRC1 = can  walk nl pace, flat grade, can't hurry or go uphills or steps s sob   Cough: none Sleeping: flat bed /  3 pillows  SABA use: maybe once a day usually p ex though hfa technique poor   02: none  Covid status: vax x 3, never infected      No obvious day to day or daytime variability or assoc excess/ purulent sputum or mucus plugs or hemoptysis or cp or chest tightness, subjective wheeze or overt sinus or hb symptoms.   Sleeping  without nocturnal  or early am exacerbation  of respiratory  c/o's or need for noct saba. Also denies any obvious fluctuation of symptoms with weather or environmental changes or other aggravating or alleviating factors except as outlined above   No unusual exposure hx or h/o childhood pna/ asthma or knowledge of premature birth.  Current Allergies, Complete Past Medical History, Past Surgical History, Family History, and Social History were reviewed in [employer] record.  ROS  The following are not active complaints unless bolded Hoarseness, sore throat, dysphagia, dental problems, itching, sneezing,  nasal congestion or discharge of excess mucus or purulent secretions, ear ache,   fever, chills, sweats, unintended wt loss or wt gain, classically pleuritic or exertional cp,  orthopnea pnd or arm/hand swelling  or leg swelling, presyncope, palpitations, abdominal pain, anorexia, nausea, vomiting, diarrhea  or change in bowel habits or change in bladder habits, change in stools or change in urine, dysuria, hematuria,  rash, arthralgias, visual complaints, headache, numbness, weakness or ataxia or problems with walking or coordination,  change in mood or  memory.        Current Meds  Medication Sig   albuterol (VENTOLIN HFA) 108 (90 Base) MCG/ACT inhaler Inhale 2 puffs into the lungs every 6 (six) hours as needed for wheezing or shortness of breath.   buPROPion (WELLBUTRIN XL) 300 MG 24 hr tablet Take 300 mg by mouth daily.   diphenhydrAMINE (BENADRYL) 25 MG tablet Take 25 mg by mouth every 6 (six) hours as needed for allergies.   hydrocortisone cream 1 % Apply 1 application topically 2 (two) times daily as needed (eczema).   irbesartan (AVAPRO) 150 MG tablet Take 1 tablet (150 mg total) by mouth daily.   LORazepam (ATIVAN) 0.5 MG tablet Take 1 tablet (0.5 mg total) by mouth every 8 (eight) hours. Take 1 hour prior to radiation therapy   montelukast (SINGULAIR) 10 MG tablet Take 10 mg by mouth at bedtime.   naproxen sodium (ALEVE) 220 MG tablet Take 220 mg by mouth daily as needed (pain).   omeprazole (PRILOSEC) 20 MG capsule Take 20 mg by mouth daily.   PENICILLIN G SODIUM IJ Inject 1 Dose as directed every 30 (thirty) days.   QUEtiapine (SEROQUEL) 200 MG tablet Take 200 mg by mouth at bedtime.   rosuvastatin (CRESTOR) 40 MG tablet Take 40 mg by mouth daily.   umeclidinium-vilanterol (ANORO ELLIPTA) 62.5-25 MCG/ACT AEPB Inhale 1 puff  into the lungs daily.   Vitamin D, Ergocalciferol, (DRISDOL) 1.25 MG (50000 UNIT) CAPS capsule Take 50,000 Units by mouth every Sunday.                   Objective:    Wts  12/23/2021       154  10/11/2021       15 3   07/08/21 157 lb 6.4 oz (71.4 kg)  06/24/21 152 lb (68.9 kg)  04/14/21 149 lb 4.8 oz (67.7 kg)  Vital signs reviewed  12/23/2021  - Note at rest 02 sats  96% on RA   General appearance:   elderly  amb pleasant wf easily confused with details of care    HEENT :  Oropharynx  clear   N    NECK :  without JVD/Nodes/TM/ nl carotid upstrokes bilaterally   LUNGS: no acc muscle use,  Mod barrel  contour chest wall with bilateral  Distant bs s audible wheeze and  without cough on insp or exp maneuvers and mod  Hyperresonant  to  percussion bilaterally     CV:  RRR  no s3 or murmur or increase in P2, and no edema   ABD:  soft and nontender    MS:   Ext warm without deformities or   obvious joint restrictions , calf tenderness, cyanosis or clubbing  SKIN: warm and dry without lesions    NEURO:  alert, approp, nl sensorium with  no motor or cerebellar deficits apparent.                Assessment

## 2021-12-23 NOTE — Patient Instructions (Signed)
Plan A = Automatic = Always=    Anoro one click 1st thing each am   Plan B = Backup (to supplement plan A, not to replace it) Only use your albuterol inhaler as a rescue medication to be used if you can't catch your breath by resting or doing a relaxed purse lip breathing pattern.  - The less you use it, the better it will work when you need it. - Ok to use the inhaler up to 2 puffs  every 4 hours if you must but call for appointment if use goes up over your usual need - Don't leave home without it !!  (think of it like the spare tire for your car)   Work on inhaler technique:  relax and gently blow all the way out then take a nice smooth full deep breath back in, triggering the inhaler at same time you start breathing in.  Hold breath in for at least  5 seconds if you can  Rinse and gargle with water when done.  If mouth or throat bother you at all,  try brushing teeth/gums/tongue with arm and hammer toothpaste/ make a slurry and gargle and spit out.  -  remember how golfers warm up - take practice breaths before       Also  Ok to try albuterol 15 min before an activity (on alternating days)  that you know would usually make you short of breath and see if it makes any difference and if makes none then don't take albuterol after activity unless you can't catch your breath as this means it's the resting that helps, not the albuterol.   Please schedule a follow up visit in 6 months but call sooner if needed

## 2021-12-23 NOTE — Assessment & Plan Note (Signed)
Counseled re importance of smoking cessation but did not meet time criteria for separate billing           Each maintenance medication was reviewed in detail including emphasizing most importantly the difference between maintenance and prns and under what circumstances the prns are to be triggered using an action plan format where appropriate.  Total time for H and P, chart review, counseling, reviewing dpi/ hfa  device(s) and generating customized AVS unique to this office visit / same day charting = 32 min

## 2022-01-30 ENCOUNTER — Other Ambulatory Visit: Payer: Self-pay | Admitting: Internal Medicine

## 2022-03-13 NOTE — Progress Notes (Signed)
Misty Mann, female    DOB: [birth year]  MRN: [id number]   Brief patient profile:  36  yowf  active smoker/MM  referred to pulmonary clinic in [place]  02/18/2021 by sister in law  for copd eval     History of Present Illness  02/18/2021  Pulmonary/ 1st office eval/ Salimah Martinovich / [place] [facility]  Chief Complaint  Patient presents with   Consult    Consult for SOB and cough Dx with COPD and emphysema  Dyspnea:  steps or nl speed walking  = MMRC2 = can't walk a nl pace on a flat grade s sob but does fine slow and flat Cough: at times min dry daytime never bloody mucus  Sleep: flat bed/ 3 pillows  SABA use: sev times  Rec Plan A = Automatic = Always=   Breztri Take 2 puffs first thing in am and then another 2 puffs about 12 hours later.   Work on inhaler technique:   Plan B = Backup (to supplement plan A, not to replace it) Only use your albuterol inhaler as a rescue medication We will schedule a Super CD CT and call to arrange a biopsy. The key is to stop smoking completely before smoking completely stops you! 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  Using anoro inhaler  Dyspnea:  MMRC1 = can walk nl pace, flat grade, can't hurry or go uphills or steps s sob   Cough: freq throat clearing daytime Sleeping:  flat bed 3 pillows  SABA use: rarely  02: none  Covid status: vax ? 3  Rec Stop lisinopril  and start ibesartan 150 mg one daily in its place and call me if any problem  No other changes   The key is to stop smoking completely before smoking completely stops you!    10/11/2021  f/u ov/[place] office/Damere Brandenburg re: GOLD 3  maint on anoro     Chief Complaint  Patient presents with   Follow-up    Feels breathing may be a little worse since last ov.     Dyspnea:  worse than usual with steps  Cough: dry sporadic no obvious trigger. Can be severe  Sleeping: no resp cc flat bed 3 pillows SABA use:  none  02: none  Rec Be sure to stop lisinopril  and start ibesartan 150 mg one daily in its place and call me if any problem  ( if already on both then just take the ibesartan twice daily but stop the lisinopril)  No other changes   The key is to stop smoking completely before smoking completely stops you! Please schedule a follow up office visit in 6 weeks, call sooner if needed Please schedule a follow up office visit in 6 weeks, call sooner if needed with all medications /inhalers/ solutions in hand so we can verify exactly what you are taking. This includes all medications from all doctors and over the counters   Addendum: Actually only on ibesartan 150 mg daily    12/23/2021  f/u ov/[place] office/Coyt Govoni re: GOLD 3 copd  maint on Anoro  Chief Complaint  Patient presents with   Follow-up    Breathing about the same since last ov  Dyspnea:  MMRC1 = can  walk nl pace, flat grade, can't hurry or go uphills or steps s sob   Cough: none Sleeping: flat bed /  3 pillows  SABA use: maybe once a day usually p ex though hfa technique poor   02: none  Covid status: vax x 3, never infected  Rec Plan A = Automatic = Always=    Anoro one click 1st thing each am  Plan B = Backup (to supplement plan A, not to replace it) Only use your albuterol inhaler as a rescue medication  Work on inhaler technique:  -  remember how golfers warm up - take practice breaths before  Also  Ok to try albuterol 15 min before an activity (on alternating days)  that you know would usually make you short of breath Please schedule a follow up visit in 6 months but call sooner if needed    03/14/2022  ACUTE ov/[place] office/Brandan Glauber re: GOLD 3  maint on anoro   Chief Complaint  Patient presents with   Acute Visit    ACUTE- cough and back and arm pain. States chest has been  hurting   Dyspnea:  Not limited by breathing from desired activities  / able to do [employer] / does not use HC parking  Cough: mostly in am / min mucoid though has smoker's rattle  Sleeping: level bed 3 pillows  SABA use: a lot  02: none  Covid status:  never infected/ 3 shots  Has multiple positional R arm and L back pains/ better with alleve and no worse with cough or activity/ wax an wane "long time"    No obvious day to day or daytime variability or assoc excess/ purulent sputum or mucus plugs or hemoptysis or chest tightness, subjective wheeze or overt sinus or hb symptoms.   Sleeping  without nocturnal  exacerbation  of respiratory  c/o's or need for noct saba. Also denies any obvious fluctuation of symptoms with weather or environmental changes or other aggravating or alleviating factors except as outlined above   No unusual exposure hx or h/o childhood pna/ asthma or knowledge of premature birth.  Current Allergies, Complete Past Medical History, Past Surgical History, Family History, and Social History were reviewed in [employer] record.  ROS  The following are not active complaints unless bolded Hoarseness, sore throat, dysphagia, dental problems, itching, sneezing,  nasal congestion or discharge of excess mucus or purulent secretions, ear ache,   fever, chills, sweats, unintended wt loss or wt gain, classically pleuritic or exertional cp,  orthopnea pnd or arm/hand swelling  or leg swelling comes and goes, presyncope, palpitations, abdominal pain, anorexia, nausea, vomiting, diarrhea  or change in bowel habits or change in bladder habits, change in stools or change in urine, dysuria, hematuria,  rash, arthralgias, visual complaints, headache, numbness, weakness or ataxia or problems with walking or coordination,  change in mood or  memory.        Current Meds  Medication Sig   albuterol (VENTOLIN HFA) 108 (90 Base) MCG/ACT inhaler Inhale 2 puffs into the  lungs every 6 (six) hours as needed for wheezing or shortness of breath.   ANORO ELLIPTA 62.5-25 MCG/ACT AEPB Inhale 1 puff by mouth once daily   buPROPion (WELLBUTRIN XL) 300 MG 24 hr tablet Take 300 mg by mouth daily.   diphenhydrAMINE (BENADRYL) 25 MG tablet Take 25 mg by mouth every 6 (six) hours as needed for allergies.   hydrocortisone cream 1 % Apply 1 application topically 2 (  two) times daily as needed (eczema).   irbesartan (AVAPRO) 150 MG tablet Take 1 tablet (150 mg total) by mouth daily.   LORazepam (ATIVAN) 0.5 MG tablet Take 1 tablet (0.5 mg total) by mouth every 8 (eight) hours. Take 1 hour prior to radiation therapy   montelukast (SINGULAIR) 10 MG tablet Take 10 mg by mouth at bedtime.   naproxen sodium (ALEVE) 220 MG tablet Take 220 mg by mouth daily as needed (pain).   omeprazole (PRILOSEC) 20 MG capsule Take 20 mg by mouth daily.   PENICILLIN G SODIUM IJ Inject 1 Dose as directed every 30 (thirty) days.   QUEtiapine (SEROQUEL) 200 MG tablet Take 200 mg by mouth at bedtime.   rosuvastatin (CRESTOR) 40 MG tablet Take 40 mg by mouth daily.   Vitamin D, Ergocalciferol, (DRISDOL) 1.25 MG (50000 UNIT) CAPS capsule Take 50,000 Units by mouth every 'Sunday.                Objective:    Wts  03/14/2022       154   12/23/2021       154  10/11/2021       15'$ 3   07/08/21 157 lb 6.4 oz (71.4 kg)  06/24/21 152 lb (68.9 kg)  04/14/21 149 lb 4.8 oz (67.7 kg)    Vital signs reviewed  03/14/2022  - Note at rest 02 sats  93% on RA   General appearance:    amb wf nad    HEENT :  Oropharynx  clear      NECK :  without JVD/Nodes/TM/ nl carotid upstrokes bilaterally   LUNGS: no acc muscle use,  Mod barrel  contour chest wall with bilateral  Distant bs s audible wheeze and  without cough on insp or exp maneuvers and mod  Hyperresonant  to  percussion bilaterally     CV:  RRR  no s3 or murmur or increase in P2, and no edema   ABD:  soft and nontender with pos mid insp Hoover's  in  the supine position. No bruits or organomegaly appreciated, bowel sounds nl  MS:   Ext warm without deformities or calf tenderness, cyanosis or clubbing Limited ext rotation and abduction R shoulder, no ax nodes  SKIN: warm and dry without lesions    NEURO:  alert, approp, nl sensorium with  no motor or cerebellar deficits apparent.                    Assessment

## 2022-03-14 ENCOUNTER — Ambulatory Visit (INDEPENDENT_AMBULATORY_CARE_PROVIDER_SITE_OTHER): Payer: Medicare Other | Admitting: Internal Medicine

## 2022-03-14 ENCOUNTER — Encounter: Payer: Self-pay | Admitting: Internal Medicine

## 2022-03-14 VITALS — BP 126/58 | HR 82 | Ht 60.0 in | Wt 154.6 lb

## 2022-03-14 DIAGNOSIS — R911 Solitary pulmonary nodule: Secondary | ICD-10-CM | POA: Diagnosis not present

## 2022-03-14 DIAGNOSIS — F17210 Nicotine dependence, cigarettes, uncomplicated: Secondary | ICD-10-CM

## 2022-03-14 DIAGNOSIS — J449 Chronic obstructive pulmonary disease, unspecified: Secondary | ICD-10-CM

## 2022-03-14 MED ORDER — BUDESONIDE-FORMOTEROL FUMARATE 80-4.5 MCG/ACT IN AERO: INHALATION_SPRAY | RESPIRATORY_TRACT | 12 refills | Status: DC

## 2022-03-14 NOTE — Patient Instructions (Addendum)
Stop anoro   Plan A = Automatic = Always=   breztri or symbicort 80 generic Take 2 puffs first thing in am and then another 2 puffs about 12 hours later.    Work on inhaler technique:  relax and gently blow all the way out then take a nice smooth full deep breath back in, triggering the inhaler at same time you start breathing in.  Hold breath in for at least  5 seconds if you can. Blow out symbicort  thru nose. Rinse and gargle with water when done.  If mouth or throat bother you at all,  try brushing teeth/gums/tongue with arm and hammer toothpaste/ make a slurry and gargle and spit out.      Plan B = Backup (to supplement plan A, not to replace it) Only use your albuterol inhaler as a rescue medication to be used if you can't catch your breath by resting or doing a relaxed purse lip breathing pattern.  - The less you use it, the better it will work when you need it. - Ok to use the inhaler up to 2 puffs  every 4 hours if you must but call for appointment if use goes up over your usual need - Don't leave home without it !!  (think of it like the spare tire for your car)   The key is to stop smoking completely before smoking completely stops you!     Please schedule a follow up visit in 3 months but call sooner if needed

## 2022-03-14 NOTE — Assessment & Plan Note (Addendum)
Counseled re importance of smoking cessation but did not meet time criteria for separate billing   °

## 2022-03-14 NOTE — Assessment & Plan Note (Signed)
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          Each maintenance medication was reviewed in detail including emphasizing most importantly the difference between maintenance and prns and under what circumstances the prns are to be triggered using an action plan format where appropriate.  Total time for H and P, chart review, counseling, reviewing hfa device(s) and generating customized AVS unique to this office visit / same day charting = 30 min

## 2022-03-14 NOTE — Assessment & Plan Note (Signed)
Active smoker/MM - PFT's  09/2020 [place]  FEV1  0.88 and FVC 1.74 with ratio 51%  - 02/18/2021  After extensive coaching inhaler device,  effectiveness =    75% from a baseline of < 10% so try breztri 2bid  - Labs ordered 02/18/2021  :     alpha one AT phenotype  MM  Level 150 - 02/18/2021   Walked on RA  x  3  lap(s) =  approx 450  ft  @ moderate pace, stopped due to end of study s sob  with lowest 02 sats 94%   - 02/18/2021 insurance changed to Anoro    >>> 03/14/2022  After extensive coaching inhaler device,  effectiveness =    50% from  a baseline of zero so try breztri sample > symb 80 2bid and prn saba since uses saba freq and cough is major cc (not limiting doe)  while on  anoro

## 2022-04-04 ENCOUNTER — Ambulatory Visit (HOSPITAL_COMMUNITY)
Admission: RE | Admit: 2022-04-04 | Discharge: 2022-04-04 | Disposition: A | Discharge status: Home / Self Care | Payer: Medicare Other | Source: Ambulatory Visit | Attending: Radiation Oncology | Admitting: Radiation Oncology

## 2022-04-04 DIAGNOSIS — C3491 Malignant neoplasm of unspecified part of right bronchus or lung: Secondary | ICD-10-CM | POA: Insufficient documentation

## 2022-04-05 ENCOUNTER — Other Ambulatory Visit: Payer: Self-pay | Admitting: Internal Medicine

## 2022-04-06 NOTE — Progress Notes (Signed)
Radiation Oncology         [phone] ________________________________  Name: Misty Mann MRN: [id number]  Date: 04/07/2022  DOB: 03/26/1945  Follow-Up Visit Note  CC: Earney Mallet, MD  Derek Jack, MD  No diagnosis found.  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 She was last seen here for follow up on 10/04/21. Since her last visit, the patient recently presented to pulmonology on 03/14/22 with c/o cough, back and arm pain, and chest pain. On examination, her back, arm, and chest pain were observed to be positional. In light of this, the patient was advised to continue using NSAIDS as needed for pain.        Her most recent chest CT without contrast on 04/04/22 showed stability of the RUL nodule and other tiny right-sided nodules. CT also showed no new or developing masses, lesions, fluid collections, or evidence of lymphadenopathy.              ***              Allergies:  has No Known Allergies.  Meds: Current Outpatient Medications  Medication Sig Dispense Refill   albuterol (VENTOLIN HFA) 108 (90 Base) MCG/ACT inhaler Inhale 2 puffs into the lungs every 6 (six) hours as needed for wheezing or shortness of breath.     budesonide-formoterol (SYMBICORT) 80-4.5 MCG/ACT inhaler Take 2 puffs first thing in am and then another 2 puffs about 12 hours later. 1 each 12   buPROPion (WELLBUTRIN  XL) 300 MG 24 hr tablet Take 300 mg by mouth daily.     diphenhydrAMINE (BENADRYL) 25 MG tablet Take 25 mg by mouth every 6 (six) hours as needed for allergies.     hydrocortisone cream 1 % Apply 1 application topically 2 (two) times daily as needed (eczema).     irbesartan (AVAPRO) 150 MG tablet Take 1 tablet (150 mg total) by mouth daily. 30 tablet 11   LORazepam (ATIVAN) 0.5 MG tablet Take 1 tablet (0.5 mg total) by mouth every 8 (eight) hours. Take 1 hour prior to radiation therapy 10 tablet 0   montelukast (SINGULAIR) 10 MG tablet Take 10 mg by mouth at bedtime.     naproxen sodium (ALEVE) 220 MG tablet Take 220 mg by mouth daily as needed (pain).     omeprazole (PRILOSEC) 20 MG capsule Take 20 mg by mouth daily.     PENICILLIN G SODIUM IJ Inject 1 Dose as directed every 30 (thirty) days.     QUEtiapine (SEROQUEL) 200 MG tablet Take 200 mg by mouth at bedtime.     rosuvastatin (CRESTOR) 40 MG tablet Take 40 mg by mouth daily.     Vitamin D, Ergocalciferol, (DRISDOL) 1.25 MG (50000 UNIT) CAPS capsule Take 50,000 Units by mouth every Sunday.     No current facility-administered medications for this encounter.    Physical Findings: The patient is in no acute distress.  Patient is alert and oriented.  vitals were not taken for this visit. .  No significant changes. Lungs are clear to auscultation bilaterally. Heart has regular rate and rhythm. No palpable cervical, supraclavicular, or axillary adenopathy. Abdomen soft, non-tender, normal bowel sounds.   Lab Findings: Lab Results  Component Value Date   WBC 6.9 02/18/2021   HGB 14.2 02/18/2021   HCT 42.6 02/18/2021   MCV 89 02/18/2021   PLT 275 02/18/2021    Radiographic Findings: CT CHEST WO CONTRAST  Result Date: 04/05/2022 CLINICAL DATA:  Non-small-cell lung cancer. Squamous cell. Right-sided. * Tracking Code: BO * EXAM: CT CHEST WITHOUT CONTRAST TECHNIQUE: Multidetector CT imaging of the chest was performed following the standard  protocol without IV contrast. RADIATION DOSE REDUCTION: This exam was performed according to the departmental dose-optimization program which includes automated exposure control, adjustment of the mA and/or kV according to patient size and/or use of iterative reconstruction technique. COMPARISON:  CT 09/24/2021 and older.  PET-CT 03/25/2021 FINDINGS: Cardiovascular: Heart is nonenlarged. Trace pericardial fluid or thickening. Coronary artery calcifications are seen. On this non IV contrast exam the thoracic aorta has a normal course and caliber. Scattered vascular calcifications. Mediastinum/Nodes: No specific abnormal lymph node enlargement in the axillary region or hilum. Few prominent mediastinal nodes identified. This includes a precarinal node which previously measured 8 mm in short axis and today 7 mm on series 2, image 51. Lungs/Pleura: Scattered centrilobular emphysematous changes are identified. The left lung is without consolidation, pneumothorax or effusion. The right lung at the apex once again has a focal nodule with adjacent fiduciary marker. The nodule on series 5, image 22 today measures 20 x 11 by 8 mm. Previously 18 x 10 x 8 mm. Elsewhere there are some small nodules identified including 2 adjacent 3 mm foci right lower lobe series 5, image 80 which is stable. Stable punctate nodule right upper lobe on series 5, image 44 measuring 2-3 mm. Upper Abdomen: The adrenal glands are preserved in the upper abdomen. Musculoskeletal: Mild curvature of the spine with some degenerative change. IMPRESSION: Stable right upper lobe spiculated nodule with an adjacent fiduciary marker. Other tiny right-sided lung nodules are stable. No developing new mass lesion, fluid collection or lymph node enlargement. Aortic Atherosclerosis (ICD10-I70.0) and Emphysema (ICD10-J43.9). Electronically Signed   By: Jill Side M.D.   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   Non-small cell carcinoma of the RUL  The patient is recovering from the effects of radiation.  ***  Plan:  ***   *** minutes of total time was spent for this patient encounter, including preparation, face-to-face counseling with the patient and coordination of care, physical exam, and documentation of the encounter. ____________________________________  Blair Promise, PhD, MD  This document serves as a record of services personally performed by Gery Pray, MD. It was created on his behalf by Roney Mans, a trained medical scribe. The creation of this record is based on the scribe's personal observations and the provider's statements to them. This document has been checked and approved by the attending provider.

## 2022-04-07 ENCOUNTER — Ambulatory Visit
Admission: RE | Admit: 2022-04-07 | Discharge: 2022-04-07 | Disposition: A | Discharge status: Home / Self Care | Payer: Medicare Other | Source: Ambulatory Visit | Attending: Radiation Oncology | Admitting: Radiation Oncology

## 2022-04-07 ENCOUNTER — Encounter: Payer: Self-pay | Admitting: Radiation Oncology

## 2022-04-07 VITALS — BP 110/56 | HR 77 | Temp 97.5°F | Resp 18 | Ht 60.0 in | Wt 153.0 lb

## 2022-04-07 DIAGNOSIS — J432 Centrilobular emphysema: Secondary | ICD-10-CM | POA: Insufficient documentation

## 2022-04-07 DIAGNOSIS — Z79899 Other long term (current) drug therapy: Secondary | ICD-10-CM | POA: Insufficient documentation

## 2022-04-07 DIAGNOSIS — Z85118 Personal history of other malignant neoplasm of bronchus and lung: Secondary | ICD-10-CM | POA: Diagnosis present

## 2022-04-07 DIAGNOSIS — C3491 Malignant neoplasm of unspecified part of right bronchus or lung: Secondary | ICD-10-CM

## 2022-04-07 DIAGNOSIS — Z7951 Long term (current) use of inhaled steroids: Secondary | ICD-10-CM | POA: Diagnosis not present

## 2022-04-07 DIAGNOSIS — I700 Atherosclerosis of aorta: Secondary | ICD-10-CM | POA: Diagnosis not present

## 2022-04-07 DIAGNOSIS — Z923 Personal history of irradiation: Secondary | ICD-10-CM | POA: Insufficient documentation

## 2022-04-07 NOTE — Progress Notes (Signed)
Misty Mann is here today for follow up post radiation to the lung.  Lung Side: Right, patient completed treatment on 05/11/21.  Does the patient complain of any of the following: Pain:No Shortness of breath w/wo exertion: Yes, mostly on exertion.  Cough: Yes, dry cough.  Hemoptysis: No Pain with swallowing: No Swallowing/choking concerns: No Appetite: No Energy Level: Good Post radiation skin Changes: No    Additional comments if applicable:   BP (!) [id number] (BP Location: Left Arm, Patient Position: Sitting)   Pulse 77   Temp (!) 97.5 F (36.4 C) (Temporal)   Resp 18   Ht 5' (1.524 m)   Wt 153 lb (69.4 kg)   SpO2 98%   BMI 29.88 kg/m

## 2022-04-21 ENCOUNTER — Telehealth: Payer: Self-pay

## 2022-04-21 NOTE — Telephone Encounter (Signed)
Received refill request for patient's lorazepam prescription. This RN noticed that prescription was last filled on 04/14/2021. Called primary number listed in patient's chart and spoke with patient's daughter Linus Orn. Linus Orn stated she believed that request was made in error and could be ignored. She stated she would speak to her mother to confirm, and call our office back if patient felt differently and wanted prescription filled.

## 2022-05-03 ENCOUNTER — Telehealth: Payer: Self-pay

## 2022-05-03 NOTE — Telephone Encounter (Signed)
Call placed to [facility] pharmacy in [place] due to receiving prescription refill request for Lorazepam 0.5mg . Patient no longer receiving radiation treat, patient to receive all refills for Lorazepam from PCP. Spoke with pharmacist whom deactivated prescription.

## 2022-05-18 ENCOUNTER — Other Ambulatory Visit: Payer: Self-pay | Admitting: Internal Medicine

## 2022-05-26 ENCOUNTER — Telehealth: Payer: Self-pay | Admitting: Internal Medicine

## 2022-05-26 NOTE — Telephone Encounter (Signed)
Fine with me  - rx is Anoro 1 click each am but be sure has f/u ov before next refill so we can assess whether this is adequate as it is not as strong as breztri

## 2022-05-26 NOTE — Telephone Encounter (Signed)
Called and spoke w/ pt daughter, she states that Misty Mann is no longer covered under the pt insurance. Daughter is requesting to go back Anoro instead. Dr.Wert please advise if that is okay?

## 2022-05-27 ENCOUNTER — Other Ambulatory Visit: Payer: Self-pay | Admitting: Internal Medicine

## 2022-05-27 NOTE — Telephone Encounter (Signed)
[place] daughter is checking on message for inhaler. Patient out of medication. [place] phone number is [phone].

## 2022-05-27 NOTE — Telephone Encounter (Signed)
Lm for patient's daughter, Tracy(DPR)

## 2022-05-28 ENCOUNTER — Other Ambulatory Visit: Payer: Self-pay | Admitting: Internal Medicine

## 2022-05-30 ENCOUNTER — Other Ambulatory Visit: Payer: Self-pay

## 2022-05-30 MED ORDER — ANORO ELLIPTA 62.5-25 MCG/ACT IN AEPB: 1.0000 | INHALATION_SPRAY | Freq: Every day | RESPIRATORY_TRACT | 0 refills | Status: DC

## 2022-05-30 NOTE — Telephone Encounter (Signed)
Pt daughter is returning missed call.

## 2022-05-30 NOTE — Telephone Encounter (Signed)
Called and spoke w/ pt daughter letting her know that MW was okay w/ pt trying Anoro and discussing @ next OV if it is helping her. Pt is scheduled for 6/4. NFN att.

## 2022-06-20 NOTE — Progress Notes (Signed)
Misty Mann, female    DOB: [birth year]  MRN: [id number]   Brief patient profile:  71  yowf  active smoker/MM  referred to pulmonary clinic in [place]  02/18/2021 by sister in law  for copd eval     History of Present Illness  02/18/2021  Pulmonary/ 1st office eval/ Misty Mann / [place] [facility]  Chief Complaint  Patient presents with   Consult    Consult for SOB and cough Dx with COPD and emphysema  Dyspnea:  steps or nl speed walking  = MMRC2 = can't walk a nl pace on a flat grade s sob but does fine slow and flat Cough: at times min dry daytime never bloody mucus  Sleep: flat bed/ 3 pillows  SABA use: sev times  Rec Plan A = Automatic = Always=   Breztri Take 2 puffs first thing in am and then another 2 puffs about 12 hours later.   Work on inhaler technique:   Plan B = Backup (to supplement plan A, not to replace it) Only use your albuterol inhaler as a rescue medication We will schedule a Super CD CT and call to arrange a biopsy. The key is to stop smoking completely before smoking completely stops you! 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  Using anoro inhaler  Dyspnea:  MMRC1 = can walk nl pace, flat grade, can't hurry or go uphills or steps s sob   Cough: freq throat clearing daytime Sleeping:  flat bed 3 pillows  SABA use: rarely  02: none  Covid status: vax ? 3  Rec Stop lisinopril  and start ibesartan 150 mg one daily in its place and call me if any problem  No other changes   The key is to stop smoking completely before smoking completely stops you!   06/21/2022  f/u ov/[place] office/Misty Mann re: GOLD 3  maint on Anoro (insurance would not cover alternatives)   Chief Complaint  Patient presents with   Follow-up    Pt f/u states that  she is doing well no current questions or concerns  Dyspnea:  doing [employer], doing steps/ no HC parking  Cough: tolerable / worse in am / clear  Sleeping: flat bed 3 pillows  SABA use: none  02: none    No obvious day to day or daytime variability or assoc excess/ purulent sputum or mucus plugs or hemoptysis or cp or chest tightness, subjective wheeze or overt sinus or hb symptoms.   Sleeping  without nocturnal  or early am exacerbation  of respiratory  c/o's or need for noct saba. Also denies any obvious fluctuation of symptoms with weather or environmental changes or other aggravating or alleviating factors except as outlined above   No unusual exposure hx or h/o childhood pna/ asthma or knowledge of premature birth.  Current Allergies, Complete Past Medical History, Past Surgical History, Family History, and Social History were reviewed in [employer] record.  ROS  The following are not active complaints unless bolded Hoarseness, sore throat, dysphagia, dental problems, itching, sneezing,  nasal congestion or discharge of excess mucus or purulent secretions, ear ache,   fever, chills, sweats, unintended wt loss or wt gain, classically pleuritic or exertional cp,  orthopnea pnd or arm/hand swelling  or leg swelling, presyncope, palpitations, abdominal pain, anorexia, nausea, vomiting, diarrhea  or change in bowel habits or change in bladder habits, change in stools or change in urine, dysuria, hematuria,  rash, arthralgias, visual complaints, headache, numbness, weakness or ataxia or problems with walking or coordination,  change in mood or  memory.        Current Meds  Medication Sig   albuterol (VENTOLIN HFA) 108 (90 Base) MCG/ACT inhaler Inhale 2 puffs into the lungs every 6 (six) hours as needed for wheezing or shortness of breath.   buPROPion (WELLBUTRIN XL) 300 MG 24 hr tablet Take 300 mg by mouth daily.   diphenhydrAMINE (BENADRYL) 25 MG tablet Take 25 mg by  mouth every 6 (six) hours as needed for allergies.   hydrocortisone cream 1 % Apply 1 application topically 2 (two) times daily as needed (eczema).   irbesartan (AVAPRO) 150 MG tablet Take 1 tablet (150 mg total) by mouth daily.   LORazepam (ATIVAN) 0.5 MG tablet Take 1 tablet (0.5 mg total) by mouth every 8 (eight) hours. Take 1 hour prior to radiation therapy   montelukast (SINGULAIR) 10 MG tablet Take 10 mg by mouth at bedtime.   naproxen sodium (ALEVE) 220 MG tablet Take 220 mg by mouth daily as needed (pain).   omeprazole (PRILOSEC) 20 MG capsule Take 20 mg by mouth daily.   PENICILLIN G SODIUM IJ Inject 1 Dose as directed every 30 (thirty) days.   QUEtiapine (SEROQUEL) 200 MG tablet Take 200 mg by mouth at bedtime.   rosuvastatin (CRESTOR) 40 MG tablet Take 40 mg by mouth daily.   umeclidinium-vilanterol (ANORO ELLIPTA) 62.5-25 MCG/ACT AEPB Inhale 1 puff into the lungs daily.   Vitamin D, Ergocalciferol, (DRISDOL) 1.25 MG (50000 UNIT) CAPS capsule Take 50,000 Units by mouth every Sunday.                Objective:    Wts  06/21/2022          151  03/14/2022       154   12/23/2021       154  10/11/2021       15 3   07/08/21 157 lb 6.4 oz (71.4 kg)  06/24/21 152 lb (68.9 kg)  04/14/21 149 lb 4.8 oz (67.7 kg)    Vital signs reviewed  06/21/2022  - Note at rest 02 sats  95% on RA   General appearance:    amb pleasant wf nad     HEENT :  Oropharynx  clear      NECK :  without JVD/Nodes/TM/ nl carotid upstrokes bilaterally   LUNGS: no acc muscle use,  Mod barrel  contour chest wall with bilateral  Distant bs s audible wheeze and  without cough on insp or exp maneuvers and mod  Hyperresonant  to  percussion bilaterally     CV:  RRR  no s3 or murmur or increase in P2, and no edema   ABD:  soft and nontender with pos mid insp Hoover's  in the supine position. No bruits or organomegaly appreciated, bowel sounds nl  MS:   Ext warm without deformities or   obvious joint restrictions  , calf tenderness, cyanosis or clubbing  SKIN: warm and dry without lesions    NEURO:  alert, approp, nl sensorium with  no motor or cerebellar deficits apparent.  Assessment

## 2022-06-21 ENCOUNTER — Ambulatory Visit (INDEPENDENT_AMBULATORY_CARE_PROVIDER_SITE_OTHER): Payer: Medicare Other | Admitting: Internal Medicine

## 2022-06-21 ENCOUNTER — Encounter: Payer: Self-pay | Admitting: Internal Medicine

## 2022-06-21 VITALS — BP 101/58 | HR 68 | Ht 60.0 in | Wt 151.8 lb

## 2022-06-21 DIAGNOSIS — J449 Chronic obstructive pulmonary disease, unspecified: Secondary | ICD-10-CM | POA: Diagnosis not present

## 2022-06-21 DIAGNOSIS — F17210 Nicotine dependence, cigarettes, uncomplicated: Secondary | ICD-10-CM | POA: Diagnosis not present

## 2022-06-21 DIAGNOSIS — R911 Solitary pulmonary nodule: Secondary | ICD-10-CM

## 2022-06-21 NOTE — Patient Instructions (Addendum)
No change in medications  The key is to stop smoking completely before smoking completely stops you!   Please schedule a follow up visit in 12  months but call sooner if needed     

## 2022-06-21 NOTE — Assessment & Plan Note (Signed)
Active smoker/MM - PFT's  09/2020 Sovah  FEV1  0.88 and FVC 1.74 with ratio 51%  - 02/18/2021  After extensive coaching inhaler device,  effectiveness =    75% from a baseline of < 10% so try breztri 2bid  - Labs ordered 02/18/2021  :     alpha one AT phenotype  MM  Level 150 - 02/18/2021   Walked on RA  x  3  lap(s) =  approx 450  ft  @ moderate pace, stopped due to end of study s sob  with lowest 02 sats 94%   - 02/18/2021 insurance changed to Anoro  - 03/14/2022  After extensive coaching inhaler device,  effectiveness =    50% from  a baseline of zero so try breztri sample >  insurance preferred anoro so resumed   Pt is Group B in terms of symptom/risk and laba/lama therefore appropriate rx at this point >>>  anoro one click each am and continue approp saba

## 2022-06-21 NOTE — Assessment & Plan Note (Signed)
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  Total time for H and P, chart review, counseling, reviewing dpi/hfa device(s) and generating customized AVS unique to this office visit / same day charting = 23 min

## 2022-06-21 NOTE — Assessment & Plan Note (Signed)
4-5 min discussion re active cigarette smoking in addition to office E&M  Ask about tobacco use:   ongoing  Advise quitting   I took an extended  opportunity with this patient to outline the consequences of continued cigarette use  in airway disorders based on all the data we have from the multiple national lung health studies (perfomed over decades at millions of dollars in cost)  indicating that smoking cessation, not choice of inhalers or pulmonary physicians, is the most important aspect of her care.   Assess willingness:  Not committed at this point Assist in quit attempt:  rec start by uncoupling activities from smoking eg am coffee before or after but not with cigs Arrange follow up:   Follow up per Primary Care planned

## 2022-06-24 ENCOUNTER — Other Ambulatory Visit: Payer: Self-pay | Admitting: Internal Medicine

## 2022-07-05 ENCOUNTER — Telehealth: Payer: Self-pay | Admitting: *Deleted

## 2022-07-05 NOTE — Telephone Encounter (Signed)
CALLED PATIENT TO INFORM OF CT FOR 10-07-22- ARRIVAL TIME- 2:15 PM @ [facility], NO RESTRICTIONS TO SCAN, PATIENT TO RECEIVE RESULTS FROM DR. KINARD ON 10-10-22 @ 10:30 AM, LVM FOR A RETURN CALL

## 2022-08-08 ENCOUNTER — Other Ambulatory Visit: Payer: Self-pay

## 2022-08-08 MED ORDER — IRBESARTAN 150 MG PO TABS: 150.0000 mg | ORAL_TABLET | Freq: Every day | ORAL | 11 refills | Status: DC

## 2022-10-05 ENCOUNTER — Telehealth: Payer: Self-pay | Admitting: *Deleted

## 2022-10-05 ENCOUNTER — Telehealth: Payer: Self-pay | Admitting: Radiation Oncology

## 2022-10-05 NOTE — Telephone Encounter (Signed)
Returned patient's daughter's phone call, spoke with daughter - Misty Mann

## 2022-10-05 NOTE — Telephone Encounter (Signed)
9/18 @ 1:31 pm Received voicemail from patient's daughter Kennith Center,  she would like to reschedule patient FU15 appointment for 9/23.  Left voicemail with Darryl Nestle, so they are aware.

## 2022-10-07 ENCOUNTER — Ambulatory Visit (HOSPITAL_COMMUNITY): Payer: Medicare Other

## 2022-10-10 ENCOUNTER — Ambulatory Visit: Payer: Medicare Other | Admitting: Radiation Oncology

## 2022-10-14 ENCOUNTER — Ambulatory Visit (HOSPITAL_COMMUNITY): Payer: Medicare Other

## 2022-10-18 ENCOUNTER — Telehealth: Payer: Self-pay | Admitting: *Deleted

## 2022-10-18 ENCOUNTER — Telehealth: Payer: Self-pay | Admitting: Radiation Oncology

## 2022-10-18 NOTE — Telephone Encounter (Signed)
10/1 @ 9:59 am Received call from patient's daughter Kennith Center to reschedule her mother for her FU15 appointment due to CT Scan had been rescheduled to 10/3.  Called and transferred call to Darryl Nestle, so they are aware.

## 2022-10-18 NOTE — Telephone Encounter (Signed)
RETURNED PATIENT'S DAUGHTER'S PHONE CALL (TRACY REYNOLDS), SPOKE WITH PATIENT'S DAUGHTER- TRACY REYNOLDS

## 2022-10-20 ENCOUNTER — Ambulatory Visit (HOSPITAL_COMMUNITY)
Admission: RE | Admit: 2022-10-20 | Discharge: 2022-10-20 | Disposition: A | Discharge status: Home / Self Care | Payer: Medicare Other | Source: Ambulatory Visit | Attending: Radiation Oncology | Admitting: Radiation Oncology

## 2022-10-20 ENCOUNTER — Ambulatory Visit: Payer: Medicare Other | Admitting: Radiation Oncology

## 2022-10-20 DIAGNOSIS — C3491 Malignant neoplasm of unspecified part of right bronchus or lung: Secondary | ICD-10-CM | POA: Insufficient documentation

## 2022-11-07 ENCOUNTER — Encounter: Payer: Self-pay | Admitting: Radiation Oncology

## 2022-11-07 ENCOUNTER — Other Ambulatory Visit: Payer: Self-pay

## 2022-11-07 ENCOUNTER — Ambulatory Visit
Admission: RE | Admit: 2022-11-07 | Discharge: 2022-11-07 | Disposition: A | Discharge status: Home / Self Care | Payer: Medicare Other | Source: Ambulatory Visit | Attending: Radiation Oncology | Admitting: Radiation Oncology

## 2022-11-07 VITALS — BP 98/69 | HR 75 | Temp 97.5°F | Resp 20 | Ht 60.0 in | Wt 148.4 lb

## 2022-11-07 DIAGNOSIS — I2510 Atherosclerotic heart disease of native coronary artery without angina pectoris: Secondary | ICD-10-CM | POA: Insufficient documentation

## 2022-11-07 DIAGNOSIS — K8020 Calculus of gallbladder without cholecystitis without obstruction: Secondary | ICD-10-CM | POA: Insufficient documentation

## 2022-11-07 DIAGNOSIS — J432 Centrilobular emphysema: Secondary | ICD-10-CM | POA: Insufficient documentation

## 2022-11-07 DIAGNOSIS — Z79899 Other long term (current) drug therapy: Secondary | ICD-10-CM | POA: Insufficient documentation

## 2022-11-07 DIAGNOSIS — I700 Atherosclerosis of aorta: Secondary | ICD-10-CM | POA: Insufficient documentation

## 2022-11-07 DIAGNOSIS — M47814 Spondylosis without myelopathy or radiculopathy, thoracic region: Secondary | ICD-10-CM | POA: Insufficient documentation

## 2022-11-07 DIAGNOSIS — Z85118 Personal history of other malignant neoplasm of bronchus and lung: Secondary | ICD-10-CM | POA: Insufficient documentation

## 2022-11-07 DIAGNOSIS — R911 Solitary pulmonary nodule: Secondary | ICD-10-CM

## 2022-11-07 DIAGNOSIS — Z923 Personal history of irradiation: Secondary | ICD-10-CM | POA: Insufficient documentation

## 2022-11-07 DIAGNOSIS — C3491 Malignant neoplasm of unspecified part of right bronchus or lung: Secondary | ICD-10-CM

## 2022-11-07 NOTE — Progress Notes (Signed)
Talicia Gosier is here today for follow up post radiation to the lung.  Lung Side: Right, patient completed treatment on 05/11/21  Does the patient complain of any of the following: Pain: No Shortness of breath w/wo exertion: Yes, mostly on exertion.  Cough: Yes, productive.  Hemoptysis: No Pain with swallowing: No  Swallowing/choking concerns: No Appetite: Good Energy Level: Good Post radiation skin Changes: No    Additional comments if applicable:   BP 98/69 (BP Location: Left Arm, Patient Position: Sitting, Cuff Size: Normal)   Pulse 75   Temp (!) 97.5 F (36.4 C)   Resp 20   Ht 5' (1.524 m)   Wt 148 lb 6.4 oz (67.3 kg)   SpO2 94%   BMI 28.98 kg/m

## 2022-11-07 NOTE — Progress Notes (Signed)
Radiation Oncology         [phone] ________________________________  Name: Misty Mann MRN: [id number]  Date: 11/07/2022  DOB: 1945-12-07  Follow-Up Visit Note  CC: Alinda Deem, MD  Doreatha Massed, MD    ICD-10-CM   1. 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 [R91.1]  R91.1       Diagnosis: The encounter diagnosis was Squamous cell lung cancer, right (HCC).   Non-small cell carcinoma of the RUL   Cancer Staging  Squamous cell lung cancer, right (HCC) Staging form: Lung, AJCC 8th Edition - Clinical stage from 04/04/2021: Stage IB (cT2a, cN0, cM0) - Unsigned  Interval Since Last Radiation: 1 year, 5 months, and 26 days   Intent: Curative  Radiation Treatment Dates: 05/04/2021 through 05/11/2021 Site Technique Total Dose (Gy) Dose per Fx (Gy) Completed Fx Beam Energies  Lung, Right: Lung_R IMRT/ SBRT 54/54 18 3/3 6XFFF    Narrative:  The patient returns today for routine follow-up and to review recent imaging. She was last seen here for follow-up on 04/07/22.      Since her last visit, she followed up with Dr. Sherene Sires at [facility] on 06/21/22. She was noted to be doing well at that time and was advised to stay on her current regimen. She does continue to smoke and was advised on smoking cessation.   Her most recent chest CT on 10/20/22 demonstrates radiation changes in the posterior RUL and stability of the underlying nodular opacity with an adjacent fiducial marker. Overall, imaging showed no acute changes and no evidence of metastatic disease.   On evaluation today she denies any pain within the chest area significant cough or hemoptysis.  She denies any significant changes in her breathing.  She has some dyspnea on exertion.                             Allergies:  has No Known Allergies.  Meds: Current Outpatient Medications  Medication Sig Dispense Refill   albuterol  (VENTOLIN HFA) 108 (90 Base) MCG/ACT inhaler Inhale 2 puffs into the lungs every 6 (six) hours as needed for wheezing or shortness of breath.     buPROPion (WELLBUTRIN XL) 300 MG 24 hr tablet Take 300 mg by mouth daily.     diphenhydrAMINE (BENADRYL) 25 MG tablet Take 25 mg by mouth every 6 (six) hours as needed for allergies.     hydrocortisone cream 1 % Apply 1 application topically 2 (two) times daily as needed (eczema).     irbesartan (AVAPRO) 150 MG tablet Take 1 tablet (150 mg total) by mouth daily. 30 tablet 11   LORazepam (ATIVAN) 0.5 MG tablet Take 1 tablet (0.5 mg total) by mouth every 8 (eight) hours. Take 1 hour prior to radiation therapy 10 tablet 0   montelukast (SINGULAIR) 10 MG tablet Take 10 mg by mouth at bedtime.     naproxen sodium (ALEVE) 220 MG tablet Take 220 mg by mouth daily as needed (pain).     omeprazole (PRILOSEC) 20 MG capsule Take 20 mg by mouth daily.     PENICILLIN G SODIUM IJ Inject 1 Dose as directed every 30 (thirty) days.     QUEtiapine (SEROQUEL) 200 MG tablet Take 200 mg by mouth at bedtime.     rosuvastatin (CRESTOR) 40 MG tablet Take 40 mg by mouth daily.  umeclidinium-vilanterol (ANORO ELLIPTA) 62.5-25 MCG/ACT AEPB Inhale 1 puff by mouth once daily 60 each 11   Vitamin D, Ergocalciferol, (DRISDOL) 1.25 MG (50000 UNIT) CAPS capsule Take 50,000 Units by mouth every Sunday.     No current facility-administered medications for this encounter.    Physical Findings: The patient is in no acute distress. Patient is alert and oriented.  Accompanied by her son on evaluation today.  height is 5' (1.524 m) and weight is 148 lb 6.4 oz (67.3 kg). Her temperature is 97.5 F (36.4 C) (abnormal). Her blood pressure is 98/69 and her pulse is 75. Her respiration is 20 and oxygen saturation is 94%. .  No significant changes. Lungs are clear to auscultation bilaterally. Heart has regular rate and rhythm. No palpable cervical, supraclavicular, or axillary adenopathy.  Abdomen soft, non-tender, normal bowel sounds.   Lab Findings: Lab Results  Component Value Date   WBC 6.9 02/18/2021   HGB 14.2 02/18/2021   HCT 42.6 02/18/2021   MCV 89 02/18/2021   PLT 275 02/18/2021    Radiographic Findings: CT CHEST WO CONTRAST  Result Date: 11/01/2022 CLINICAL DATA:  Non-small cell lung cancer EXAM: CT CHEST WITHOUT CONTRAST TECHNIQUE: Multidetector CT imaging of the chest was performed following the standard protocol without IV contrast. RADIATION DOSE REDUCTION: This exam was performed according to the departmental dose-optimization program which includes automated exposure control, adjustment of the mA and/or kV according to patient size and/or use of iterative reconstruction technique. COMPARISON:  04/04/2022 FINDINGS: Cardiovascular: The heart is normal in size. No pericardial effusion. No evidence of thoracic aortic aneurysm. Atherosclerotic calcifications of the aortic arch. Mild atherosclerotic calcifications of the LAD and left circumflex. Mediastinum/Nodes: Small mediastinal lymph nodes which do not meet pathologic CT size criteria, including knee mm short axis low right paratracheal node (series 3/image 20). Visualized thyroid is unremarkable. Lungs/Pleura: Radiation changes in the posterior right upper lobe. Underlying 2.2 x 1.0 cm irregular nodular opacity with adjacent fiducial marker (series 5/image 25), grossly unchanged. No new/suspicious pulmonary nodules. Mild centrilobular and paraseptal emphysematous changes, upper lung predominant. No focal consolidation. No pleural effusion or pneumothorax. Upper Abdomen: Visualized upper abdomen is notable for small layering gallstones (series 3/image 87) and vascular calcifications. Musculoskeletal: Mild degenerative changes in the thoracic spine. IMPRESSION: Radiation changes in the posterior right upper lobe. Underlying nodular opacity with adjacent fiducial marker, grossly unchanged. No evidence of metastatic  disease. Aortic Atherosclerosis (ICD10-I70.0) and Emphysema (ICD10-J43.9). Electronically Signed   By: Charline Bills M.D.   On: 11/01/2022 20:42    Impression: The encounter diagnosis was Squamous cell lung cancer, right (HCC).   Non-small cell carcinoma of the RUL  No evidence of recurrence on clinical exam today.  Recent chest CT scan favorable.  Plan: Routine follow-up in 6 months.  Prior to this follow-up appointment she will have a repeat CT scan of the chest.   22 minutes of total time was spent for this patient encounter, including preparation, face-to-face counseling with the patient and coordination of care, physical exam, and documentation of the encounter. ____________________________________  Billie Lade, PhD, MD  This document serves as a record of services personally performed by Antony Blackbird, MD. It was created on his behalf by Neena Rhymes, a trained medical scribe. The creation of this record is based on the scribe's personal observations and the provider's statements to them. This document has been checked and approved by the attending provider.

## 2023-04-08 IMAGING — DX DG CHEST 1V PORT
1 series · 1 of 1 positions shown · non-contrast
Comparison: Chest CT of February 24, 2021.

CLINICAL DATA: A 76-year-old female presents with RIGHT upper lobe
lesion post bronchoscopy.

EXAM:
PORTABLE CHEST 1 VIEW

[chest]
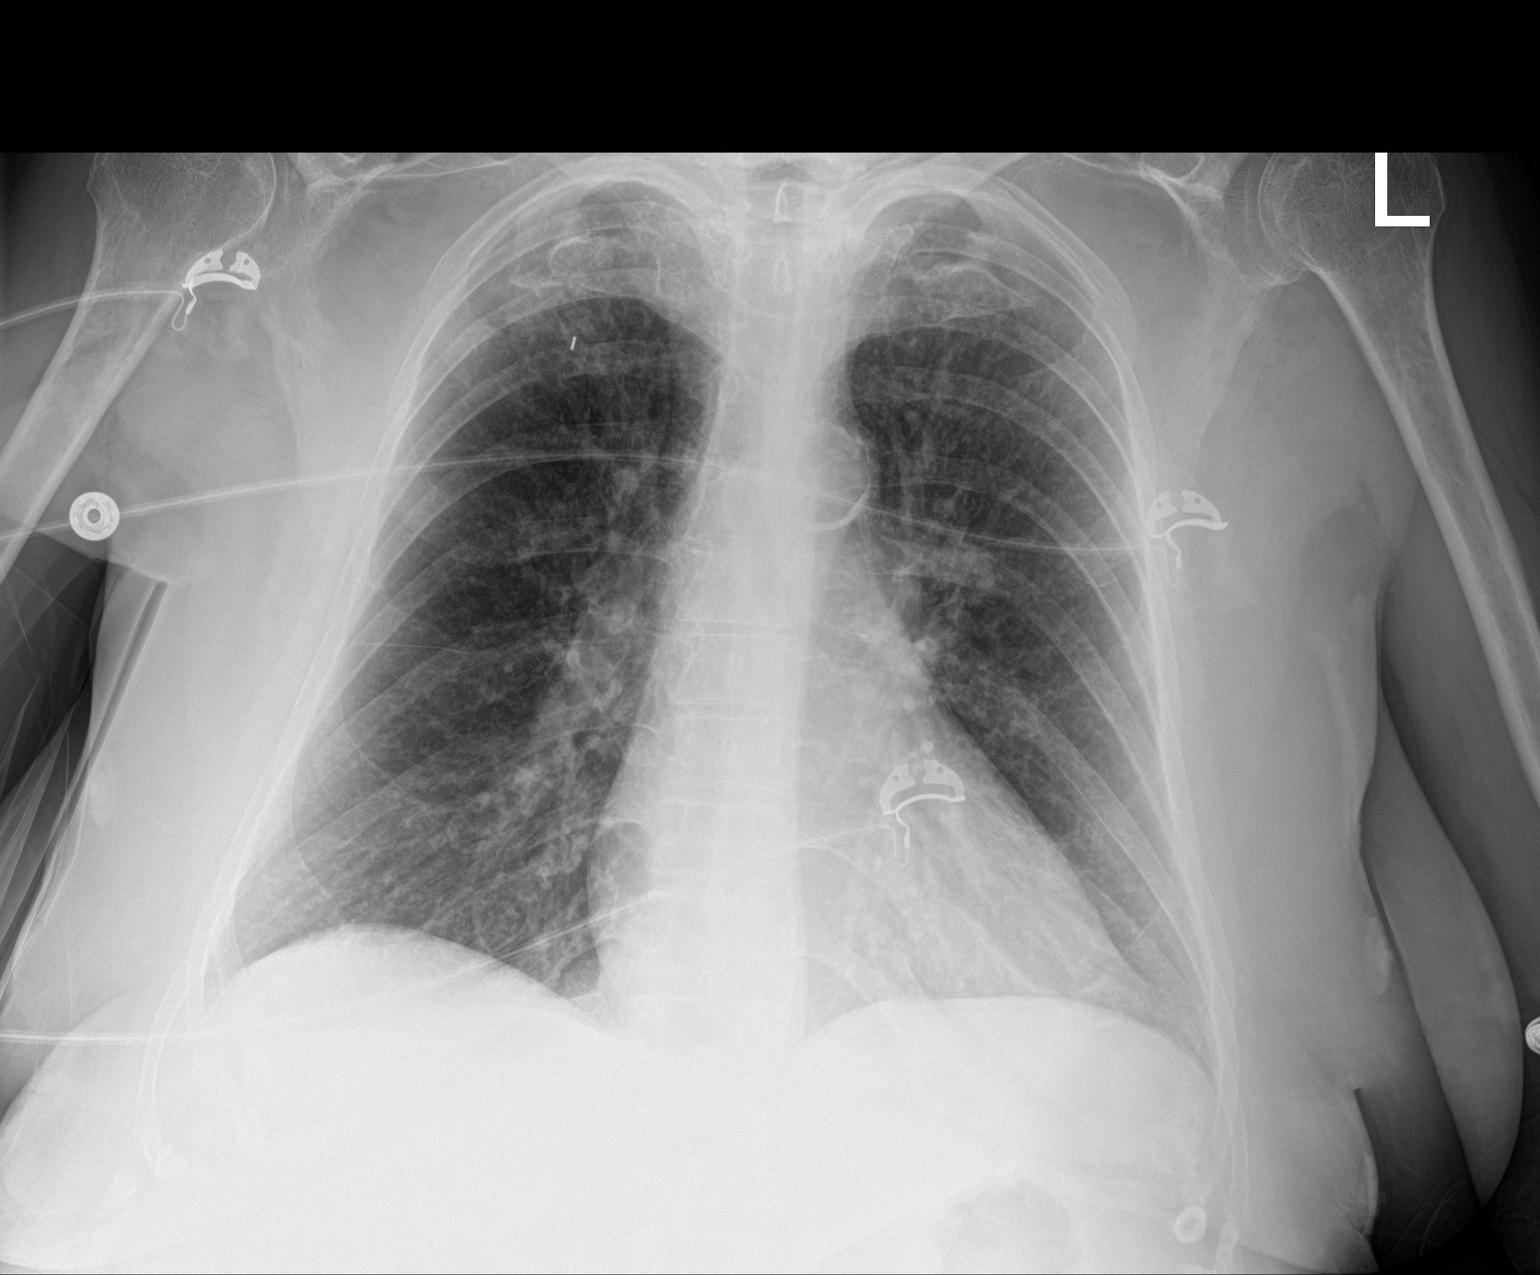

[1 of 1 positions shown; findings below may reference images not displayed]

FINDINGS: EKG leads project over the chest.

Cardiomediastinal contours and hilar structures are normal.

Added density at the RIGHT lung apex. Fiducial marker has been
placed in the interval projecting over the RIGHT upper lobe.

No signs of lobar consolidation. No signs of effusion or visible
pneumothorax.

On limited assessment there is no acute skeletal process.
IMPRESSION: Added density at the RIGHT lung apex post biopsy compatible with
known pulmonary nodule now post fiducial marker placement and
presumed biopsy.

No acute cardiopulmonary disease.

## 2023-04-13 ENCOUNTER — Telehealth: Payer: Self-pay | Admitting: *Deleted

## 2023-04-13 NOTE — Telephone Encounter (Signed)
 CALLED PATIENT TO INFORM OF CT FOR 05-08-23- ARRIVAL TIME- 12:15 PM @ [facility], NO RESTRICTIONS TO SCAN, PATIENT TO RECEIVE RESULTS FROM DR. KINARD ON 05-15-23 @ 10:30 AM, LVM FOR A RETURN CALL

## 2023-04-25 IMAGING — PT NM PET TUM IMG INITIAL (PI) SKULL BASE T - THIGH
1 series · 6 of 6 positions shown · non-contrast
Comparison: Chest CT 02/24/2021

CLINICAL DATA: Initial treatment strategy for non-small cell lung
cancer.

EXAM:
NUCLEAR MEDICINE PET SKULL BASE TO THIGH
TECHNIQUE: 7.5 mCi F-18 FDG was injected intravenously. Full-ring PET imaging
was performed from the skull base to thigh after the radiotracer. CT
data was obtained and used for attenuation correction and anatomic
localization.
Fasting blood glucose: 108 mg/dl

[Series 1048: results mm oncology reading · 5.0mm · 0.65mm/px · 6 of 6 slices shown]
[im 1/6]
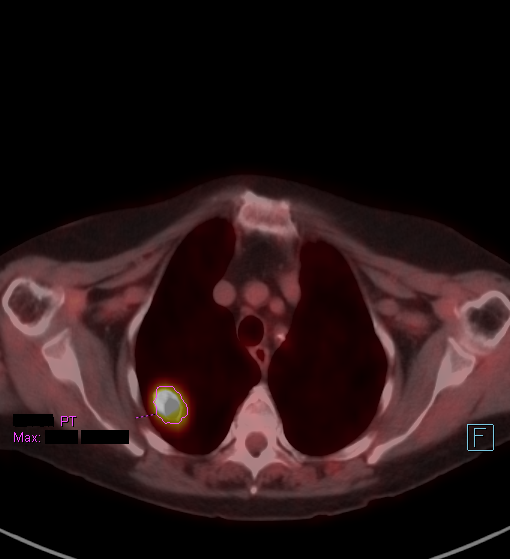
[im 2/6]
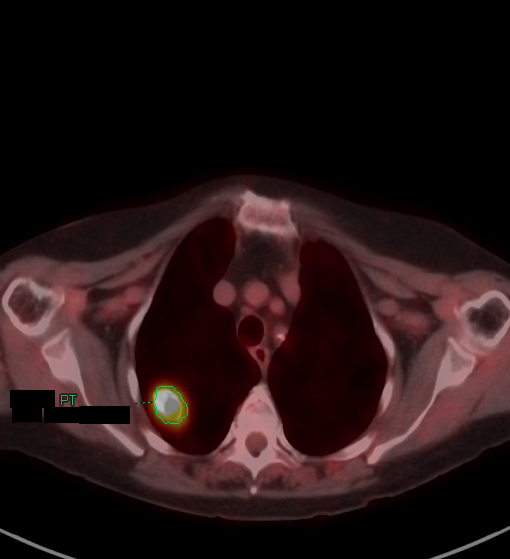
[im 3/6]
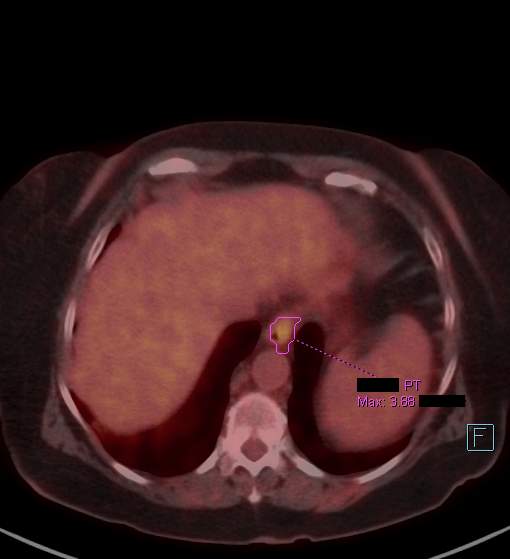
[im 4/6]
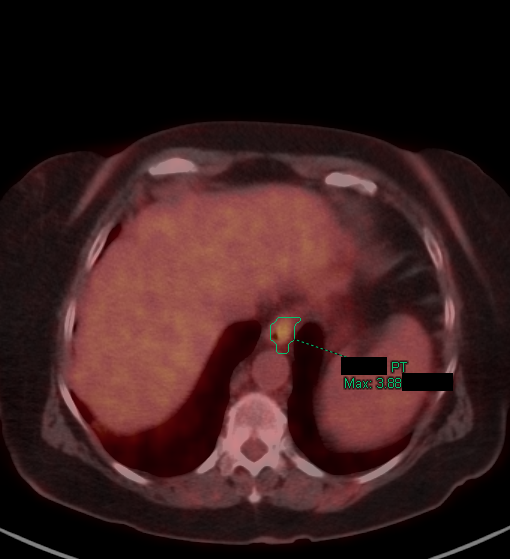
[im 5/6]
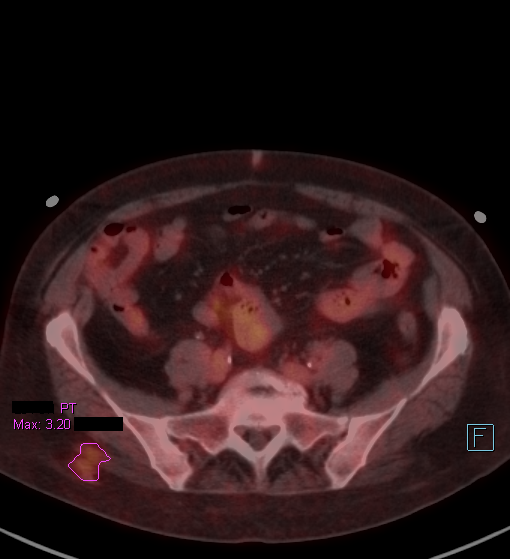
[im 6/6]
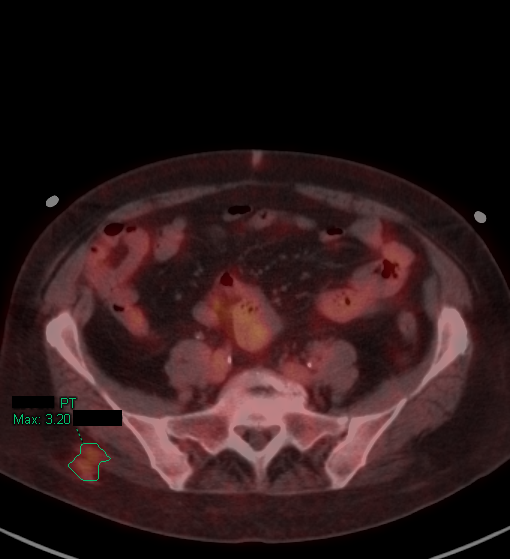

[6 of 6 positions shown; findings below may reference images not displayed]

FINDINGS: Mediastinal blood pool activity: SUV max

Liver activity: SUV max NA

NECK: No significant abnormal hypermetabolic activity in this
region.

Incidental CT findings: none

CHEST: The cavitary 3.1 by 1.8 cm right upper lobe lung mass on
image 17 of series 7 has a maximum SUV of 12.5, compatible with
malignancy. The small subpleural nodularity posteriorly in the right
upper lobe on image 12 series 7 is not appreciably hypermetabolic
but 0.6 by 0.3 cm is below sensitive PET-CT size thresholds.

Mildly accentuated activity in the distal esophagus, maximum SUV
3.9, technically nonspecific although physiologic activity can often
occur in this region.

Incidental CT findings: Airway thickening is present, suggesting
bronchitis or reactive airways disease. Centrilobular emphysema.
Coronary, aortic arch, and branch vessel atherosclerotic vascular
disease. Clustered small lower thoracic periaortic lymph nodes are
not hypermetabolic.

ABDOMEN/PELVIS: Faint bandlike density in the subcutaneous tissues
adjacent to the upper margin of the right gluteus maximus muscle on
image 180 series 3 with maximum SUV 3.2, likely inflammatory.

Incidental CT findings: Cholelithiasis. Atherosclerosis is present,
including aortoiliac atherosclerotic disease. Possible lipoma in the
transverse duodenum, image 162 series 3.

SKELETON: No significant abnormal hypermetabolic activity in this
region.

Incidental CT findings: none
IMPRESSION: 1. The right upper lobe cavitary mass has maximum SUV of 12.5,
compatible with malignancy. No hypermetabolic adenopathy or
compelling findings of distant metastatic spread.
2. Mild focal accentuated activity in the distal esophagus, maximum
SUV 3.9, technically nonspecific although physiologic activity can
occur in this region.
3. Other imaging findings of potential clinical significance: Airway
thickening is present, suggesting bronchitis or reactive airways
disease. Centrilobular emphysema. Aortic Atherosclerosis
([id number]) and Emphysema ([id number].9). Coronary atherosclerosis.
Cholelithiasis. Lipoma in the transverse duodenum. Likely
inflammatory activity in the subcutaneous tissues along the upper
margin of the right gluteus maximus muscle.

## 2023-05-08 ENCOUNTER — Ambulatory Visit (HOSPITAL_COMMUNITY)

## 2023-05-09 ENCOUNTER — Telehealth: Payer: Self-pay | Admitting: *Deleted

## 2023-05-09 NOTE — Telephone Encounter (Signed)
 Called patient to inform of Ct fpr 05-11-23- arrival time- 10:15 am @ [facility], no restrictions to scan, patient to receive results from Dr. Eloise Hake on 05/15/23 @ 10:30 am, lvm for a return call

## 2023-05-11 ENCOUNTER — Ambulatory Visit (HOSPITAL_COMMUNITY): Attending: Radiation Oncology

## 2023-05-15 ENCOUNTER — Inpatient Hospital Stay
Admission: RE | Admit: 2023-05-15 | Discharge: 2023-05-15 | Disposition: A | Discharge status: Home / Self Care | Source: Ambulatory Visit | Admitting: Radiation Oncology

## 2023-05-15 ENCOUNTER — Ambulatory Visit: Payer: Self-pay | Admitting: Radiation Oncology

## 2023-06-27 ENCOUNTER — Other Ambulatory Visit: Payer: Self-pay | Admitting: Internal Medicine

## 2023-06-28 ENCOUNTER — Other Ambulatory Visit: Payer: Self-pay | Admitting: Internal Medicine

## 2023-06-29 ENCOUNTER — Telehealth: Payer: Self-pay | Admitting: Internal Medicine

## 2023-06-29 NOTE — Telephone Encounter (Signed)
 Per pt chart, refill for Anoro Ellipta  was sent to preferred pharmacy today and pt was scheduled today for upcoming appt in August per the request from Dr. Jacqui Mau prescription. Attempted courtesy call to pt to inform her, LVM for call back if questions or concerns.

## 2023-06-29 NOTE — Telephone Encounter (Signed)
 Copied from CRM [id number]. Topic: Clinical - Medication Refill >> Jun 29, 2023 12:45 PM Ilean Mall F wrote: Medication: ANORO ELLIPTA  62.5-25 MCG/ACT AEPB   Has the patient contacted their pharmacy? Yes; did not refill before expiration date. Needs a new prescription sent. (Agent: If no, request that the patient contact the pharmacy for the refill. If patient does not wish to contact the pharmacy document the reason why and proceed with request.) (Agent: If yes, when and what did the pharmacy advise?)  This is the patient's preferred pharmacy:  [facility] - [place], [place] - [address] [place] [address] Phone: [phone] Fax: [phone]  Is this the correct pharmacy for this prescription? Yes If no, delete pharmacy and type the correct one.   Has the prescription been filled recently? No  Is the patient out of the medication? Yes; 3 puffs left.   Has the patient been seen for an appointment in the last year OR does the patient have an upcoming appointment? Yes  Can we respond through MyChart? No  Agent: Please be advised that Rx refills may take up to 3 business days. We ask that you follow-up with your pharmacy.

## 2023-07-25 ENCOUNTER — Other Ambulatory Visit: Payer: Self-pay | Admitting: Internal Medicine

## 2023-08-01 ENCOUNTER — Other Ambulatory Visit: Payer: Self-pay | Admitting: Internal Medicine

## 2023-08-21 ENCOUNTER — Other Ambulatory Visit: Payer: Self-pay | Admitting: Internal Medicine

## 2023-08-23 ENCOUNTER — Other Ambulatory Visit: Payer: Self-pay | Admitting: Internal Medicine

## 2023-08-24 ENCOUNTER — Other Ambulatory Visit: Payer: Self-pay | Admitting: Internal Medicine

## 2023-08-27 NOTE — Progress Notes (Unsigned)
 Misty Mann, female    DOB: 12-01-1945  MRN: [id number]   Brief patient profile:  69 yowf  active smoker/MM  referred to pulmonary clinic in [place]  02/18/2021 by sister in law  for copd eval     History of Present Illness  02/18/2021  Pulmonary/ 1st office eval/ Jenetta Wease / [facility]  Chief Complaint  Patient presents with   Consult    Consult for SOB and cough Dx with COPD and emphysema  Dyspnea:  steps or nl speed walking  = MMRC2 = can't walk a nl pace on a flat grade s sob but does fine slow and flat Cough: at times min dry daytime never bloody mucus  Sleep: flat bed/ 3 pillows  SABA use: sev times  Rec Plan A = Automatic = Always=   Breztri  Take 2 puffs first thing in am and then another 2 puffs about 12 hours later.   Work on inhaler technique:   Plan B = Backup (to supplement plan A, not to replace it) Only use your albuterol  inhaler as a rescue medication We will schedule a Super CD CT and call to arrange a biopsy. The key is to stop smoking completely before smoking completely stops you! 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  Using anoro inhaler  Dyspnea:  MMRC1 = can walk nl pace, flat grade, can't hurry or go uphills or steps s sob   Cough: freq throat clearing daytime Sleeping:  flat bed 3 pillows  SABA use: rarely  02: none  Covid status: vax ? 3  Rec Stop lisinopril  and start ibesartan 150 mg one daily in its place and call me if any problem  No other changes   The key is to stop smoking completely before smoking completely stops you!   06/21/2022  f/u ov/[place] office/Madelene Kaatz re: GOLD 3 COPD   maint on Anoro (insurance would not cover alternatives)   Chief Complaint  Patient presents with   Follow-up    Pt f/u states  that she is doing well no current questions or concerns  Dyspnea:  doing [employer], doing steps/ no HC parking  Cough: tolerable / worse in am / clear  Sleeping: flat bed 3 pillows  SABA use: none  02: none  Rec No change in medications  The key is to stop smoking completely before smoking completely stops you!   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    Also denies any obvious fluctuation of symptoms with weather or environmental changes or other aggravating or alleviating factors except as outlined above  No unusual exposure hx or h/o childhood pna/ asthma or knowledge of premature birth.  Current Allergies, Complete Past Medical History, Past Surgical History, Family History, and Social History were reviewed in [employer] record.  ROS  The following are not active complaints unless bolded Hoarseness, sore throat, dysphagia, dental problems, itching, sneezing,  nasal congestion or discharge of excess mucus or purulent secretions, ear ache,   fever, chills, sweats, unintended wt loss or wt gain, classically pleuritic or exertional cp,  orthopnea pnd or arm/hand swelling  or leg swelling, presyncope, palpitations, abdominal pain, anorexia, nausea, vomiting, diarrhea  or change in bowel habits or change in bladder habits, change in stools or change in urine, dysuria, hematuria,  rash, arthralgias, visual complaints, headache, numbness, weakness or ataxia or  problems with walking or coordination,  change in mood or  memory.        Current Meds  Medication Sig   albuterol  (VENTOLIN  HFA) 108 (90 Base) MCG/ACT inhaler Inhale 2 puffs into the lungs every 6 (six) hours as needed for wheezing or shortness of breath.   ANORO ELLIPTA  62.5-25 MCG/ACT AEPB Inhale 1 puff by mouth once daily   buPROPion (WELLBUTRIN XL) 300 MG 24 hr tablet Take 300 mg by mouth daily.   diphenhydrAMINE (BENADRYL) 25 MG tablet Take 25 mg by mouth every 6 (six) hours as needed for allergies.   hydrocortisone cream 1 % Apply 1 application topically 2 (two) times daily as needed (eczema).   irbesartan  (AVAPRO ) 150 MG tablet Take 1 tablet (150 mg total) by mouth daily.   LORazepam  (ATIVAN ) 0.5 MG tablet Take 1 tablet (0.5 mg total) by mouth every 8 (eight) hours. Take 1 hour prior to radiation therapy   montelukast (SINGULAIR) 10 MG tablet Take 10 mg by mouth at bedtime.   naproxen sodium (ALEVE) 220 MG tablet Take 220 mg by mouth daily as needed (pain).   omeprazole (PRILOSEC) 20 MG capsule Take 20 mg by mouth daily.   PENICILLIN G SODIUM IJ Inject 1 Dose as directed every 30 (thirty) days.   QUEtiapine (SEROQUEL) 200 MG tablet Take 200 mg by mouth at bedtime.   rosuvastatin (CRESTOR) 40 MG tablet Take 40 mg by mouth daily.   Vitamin D, Ergocalciferol, (DRISDOL) 1.25 MG (50000 UNIT) CAPS capsule Take 50,000 Units by mouth every Sunday.              Objective:    Wts  08/30/2023        149  06/21/2022          151  03/14/2022       154   12/23/2021       154  10/11/2021       15 3   07/08/21 157 lb 6.4 oz (71.4 kg)  06/24/21 152 lb (68.9 kg)  04/14/21 149 lb 4.8 oz (67.7 kg)    Vital signs reviewed  08/30/2023  - Note at rest 02 sats  91% on RA    General appearance:    pleasant amb wf nad   HEENT :  Oropharynx  clear   Nasal turbinates nl    NECK :  without JVD/Nodes/TM/ nl carotid upstrokes bilaterally   LUNGS: no acc muscle use,  Mod barrel  contour chest wall  with bilateral  Distant bs s audible wheeze and  without cough on insp or exp maneuvers and mod  Hyperresonant  to  percussion bilaterally     CV:  RRR  no s3 or  murmur or increase in P2, and no edema   ABD:  soft and nontender   MS:   Ext warm without deformities or   obvious joint restrictions , calf tenderness, cyanosis or clubbing  SKIN: warm and dry without lesions    NEURO:  alert, approp, nl sensorium with  no motor or cerebellar deficits apparent.                                    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 - PFT's  09/2020 Sovah  FEV1  0.88 and FVC 1.74 with ratio 51%  - 02/18/2021  After extensive coaching inhaler device,  effectiveness =    75% from a baseline of < 10% so try breztri  2bid  - Labs ordered 02/18/2021  :     alpha one AT phenotype  MM  Level 150 - 02/18/2021   Walked on RA  x  3  lap(s) =  approx 450  ft  @ moderate pace, stopped due to end of study s sob  with lowest 02 sats 94%   - 02/18/2021 insurance changed to Anoro  - 03/14/2022  After extensive coaching inhaler device,  effectiveness =    50% from  a baseline of zero so try breztri  sample >  insurance preferred anoro so resumed   Pt is Group B in terms of symptom/risk and laba/lama therefore appropriate rx at this point >>>  anoro and approp saba:  Re SABA :  I spent extra time with pt today reviewing appropriate use of albuterol  for prn use on exertion with the following points: 1) saba is for relief of sob that does not improve by walking a slower pace or resting but rather if the pt does not improve after trying this first. 2) If the pt is convinced, as many are, that saba helps recover from activity faster then it's easy to tell if this is the case by re-challenging : ie stop, take the inhaler, then p 5 minutes try the exact same activity (intensity of workload) that just caused the symptoms and see if they are substantially  diminished or not after saba 3) if there is an activity that reproducibly causes the symptoms, try the saba 15 min before the activity on alternate days   If in fact the saba really does help, then fine to continue to use it prn but advised may need to look closer at the maintenance regimen being used to achieve better control of airways disease with exertion.       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  Total time for H and P, chart review, counseling, reviewing hfa/ dpi device(s) and generating customized AVS unique to this office  visit / same day charting = 20 min       Cigarette smoker 3-5 min discussion re active cigarette smoking in addition to office E&M  Ask about tobacco use:   ongoing Advise quitting   I took an extended  opportunity with this patient to outline the consequences of continued cigarette use  in airway disorders based on all the data we have from the multiple national lung health studies (perfomed over decades at millions of dollars in cost)  indicating that smoking cessation, not choice of inhalers or pulmonary physicians, is the most important aspect of her  care.   Assess willingness:  Not committed at this point Assist in quit attempt:  Per PCP when ready Arrange follow up:   Follow up per Primary Care planned                     Need for vaccination against Streptococcus pneumoniae Given Prevnar 20 today      Patient Instructions  Continue Anoro each am   Use your albuterol  as a rescue medication to be used if you can't catch your breath by resting, slowing your pace,  or doing a relaxed purse lip breathing pattern.  - The less you use it, the better it will work when you need it. - Ok to use up to 2 puffs  every 4 hours if you must but call for  appointment if use goes up over your usual need - Don't leave home without it !!  (think of it like a spare tire or starter fluid for your car)   Also  Ok to try albuterol  15 min before an activity (on alternating days)  that you know would usually make you short of breath and see if it makes any difference and if makes none then don't take albuterol  after activity unless you can't catch your breath as this means it's the resting that helps, not the albuterol .      The key is to stop smoking completely before smoking completely stops you!    Please schedule a follow up visit in 12  months but call sooner if needed    Ozell America, MD 08/30/2023

## 2023-08-30 ENCOUNTER — Encounter: Payer: Self-pay | Admitting: Internal Medicine

## 2023-08-30 ENCOUNTER — Ambulatory Visit (INDEPENDENT_AMBULATORY_CARE_PROVIDER_SITE_OTHER): Admitting: Internal Medicine

## 2023-08-30 VITALS — BP 147/75 | HR 79 | Ht 60.0 in | Wt 149.0 lb

## 2023-08-30 DIAGNOSIS — Z23 Encounter for immunization: Secondary | ICD-10-CM

## 2023-08-30 DIAGNOSIS — R911 Solitary pulmonary nodule: Secondary | ICD-10-CM | POA: Diagnosis not present

## 2023-08-30 DIAGNOSIS — F17210 Nicotine dependence, cigarettes, uncomplicated: Secondary | ICD-10-CM | POA: Diagnosis not present

## 2023-08-30 DIAGNOSIS — J449 Chronic obstructive pulmonary disease, unspecified: Secondary | ICD-10-CM

## 2023-08-30 MED ORDER — ALBUTEROL SULFATE HFA 108 (90 BASE) MCG/ACT IN AERS: 2.0000 | INHALATION_SPRAY | RESPIRATORY_TRACT | 11 refills | Status: AC | PRN

## 2023-08-30 MED ORDER — ANORO ELLIPTA 62.5-25 MCG/ACT IN AEPB: 1.0000 | INHALATION_SPRAY | Freq: Every day | RESPIRATORY_TRACT | 11 refills | Status: AC

## 2023-08-30 NOTE — Assessment & Plan Note (Addendum)
 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 - PFT's  09/2020 Sovah  FEV1  0.88 and FVC 1.74 with ratio 51%  - 02/18/2021  After extensive coaching inhaler device,  effectiveness =    75% from a baseline of < 10% so try breztri  2bid  - Labs ordered 02/18/2021  :     alpha one AT phenotype  MM  Level 150 - 02/18/2021   Walked on RA  x  3  lap(s) =  approx 450  ft  @ moderate pace, stopped due to end of study s sob  with lowest 02 sats 94%   - 02/18/2021 insurance changed to Anoro  - 03/14/2022  After extensive coaching inhaler device,  effectiveness =    50% from  a baseline of zero so try breztri  sample >  insurance preferred anoro so resumed   Pt is Group B in terms of symptom/risk and laba/lama therefore appropriate rx at this point >>>  anoro and approp saba:  Re SABA :  I spent extra time with pt today reviewing appropriate use of albuterol  for prn use on exertion with the following points: 1) saba is for relief of sob that does not improve by walking a slower pace or resting but rather if the pt does not improve after trying this first. 2) If the pt is convinced, as many are, that saba helps recover from activity faster then it's easy to tell if this is the case by re-challenging : ie stop, take the inhaler, then p 5 minutes try the exact same activity (intensity of workload) that just caused the symptoms and see if they are substantially diminished or not after saba 3) if there is an activity that reproducibly causes the symptoms, try the saba 15 min before the activity on alternate days   If in fact the saba really does help, then fine to continue to use it prn but advised may need to look closer at the maintenance regimen being used to achieve better control of airways disease with exertion.

## 2023-08-30 NOTE — Assessment & Plan Note (Addendum)
 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  Total time for H and P, chart review, counseling, reviewing hfa/ dpi device(s) and generating customized AVS unique to this office visit / same day charting = 20 min

## 2023-08-30 NOTE — Patient Instructions (Addendum)
 Continue Anoro each am   Use your albuterol  as a rescue medication to be used if you can't catch your breath by resting, slowing your pace,  or doing a relaxed purse lip breathing pattern.  - The less you use it, the better it will work when you need it. - Ok to use up to 2 puffs  every 4 hours if you must but call for  appointment if use goes up over your usual need - Don't leave home without it !!  (think of it like a spare tire or starter fluid for your car)   Also  Ok to try albuterol  15 min before an activity (on alternating days)  that you know would usually make you short of breath and see if it makes any difference and if makes none then don't take albuterol  after activity unless you can't catch your breath as this means it's the resting that helps, not the albuterol .      The key is to stop smoking completely before smoking completely stops you!    Please schedule a follow up visit in 12  months but call sooner if needed

## 2023-08-30 NOTE — Assessment & Plan Note (Addendum)
 3-5 min discussion re active cigarette smoking in addition to office E&M  Ask about tobacco use:  ongoing  Advise quitting   I took an extended  opportunity with this patient to outline the consequences of continued cigarette use  in airway disorders based on all the data we have from the multiple national lung health studies (perfomed over decades at millions of dollars in cost)  indicating that smoking cessation, not choice of inhalers or pulmonary physicians, is the most important aspect of her  care.   Assess willingness:  Not committed at this point Assist in quit attempt:  Per PCP when ready Arrange follow up:   Follow up per Primary Care planned

## 2023-10-22 ENCOUNTER — Other Ambulatory Visit: Payer: Self-pay | Admitting: Internal Medicine

## 2023-11-13 ENCOUNTER — Encounter (HOSPITAL_COMMUNITY): Payer: Self-pay

## 2023-11-13 ENCOUNTER — Emergency Department (HOSPITAL_COMMUNITY)

## 2023-11-13 ENCOUNTER — Other Ambulatory Visit: Payer: Self-pay

## 2023-11-13 ENCOUNTER — Emergency Department (HOSPITAL_COMMUNITY)
Admission: EM | Admit: 2023-11-13 | Discharge: 2023-11-13 | Disposition: A | Discharge status: Home / Self Care | Source: Ambulatory Visit | Attending: Emergency Medicine | Admitting: Emergency Medicine

## 2023-11-13 DIAGNOSIS — L03116 Cellulitis of left lower limb: Secondary | ICD-10-CM | POA: Diagnosis present

## 2023-11-13 LAB — URINALYSIS, W/ REFLEX TO CULTURE (INFECTION SUSPECTED)
Bacteria, UA: NONE SEEN
Bilirubin Urine: NEGATIVE
Glucose, UA: NEGATIVE mg/dL
Hgb urine dipstick: NEGATIVE
Ketones, ur: NEGATIVE mg/dL
Nitrite: NEGATIVE
Protein, ur: 100 mg/dL — AB
Specific Gravity, Urine: 1.025 (ref 1.005–1.030)
pH: 6 (ref 5.0–8.0)

## 2023-11-13 LAB — CBC WITH DIFFERENTIAL/PLATELET
Abs Immature Granulocytes: 0.02 K/uL (ref 0.00–0.07)
Basophils Absolute: 0 K/uL (ref 0.0–0.1)
Basophils Relative: 0 %
Eosinophils Absolute: 0.3 K/uL (ref 0.0–0.5)
Eosinophils Relative: 4 %
HCT: 41.3 % (ref 36.0–46.0)
Hemoglobin: 13.5 g/dL (ref 12.0–15.0)
Immature Granulocytes: 0 %
Lymphocytes Relative: 14 %
Lymphs Abs: 1 K/uL (ref 0.7–4.0)
MCH: 31.1 pg (ref 26.0–34.0)
MCHC: 32.7 g/dL (ref 30.0–36.0)
MCV: 95.2 fL (ref 80.0–100.0)
Monocytes Absolute: 0.5 K/uL (ref 0.1–1.0)
Monocytes Relative: 8 %
Neutro Abs: 5 K/uL (ref 1.7–7.7)
Neutrophils Relative %: 74 %
Platelets: 260 K/uL (ref 150–400)
RBC: 4.34 MIL/uL (ref 3.87–5.11)
RDW: 13.8 % (ref 11.5–15.5)
WBC: 6.9 K/uL (ref 4.0–10.5)
nRBC: 0 % (ref 0.0–0.2)

## 2023-11-13 LAB — COMPREHENSIVE METABOLIC PANEL WITH GFR
ALT: 6 U/L (ref 0–44)
AST: 18 U/L (ref 15–41)
Albumin: 4.4 g/dL (ref 3.5–5.0)
Alkaline Phosphatase: 113 U/L (ref 38–126)
Anion gap: 10 (ref 5–15)
BUN: 18 mg/dL (ref 8–23)
CO2: 29 mmol/L (ref 22–32)
Calcium: 8.9 mg/dL (ref 8.9–10.3)
Chloride: 104 mmol/L (ref 98–111)
Creatinine, Ser: 1.07 mg/dL — ABNORMAL HIGH (ref 0.44–1.00)
GFR, Estimated: 54 mL/min — ABNORMAL LOW (ref >60)
Glucose, Bld: 93 mg/dL (ref 70–99)
Potassium: 4.5 mmol/L (ref 3.5–5.1)
Sodium: 142 mmol/L (ref 135–145)
Total Bilirubin: 0.5 mg/dL (ref 0.0–1.2)
Total Protein: 7.3 g/dL (ref 6.5–8.1)

## 2023-11-13 LAB — LACTIC ACID, PLASMA: Lactic Acid, Venous: 1.1 mmol/L (ref 0.5–1.9)

## 2023-11-13 MED ORDER — DOXYCYCLINE HYCLATE 100 MG PO CAPS: 100.0000 mg | ORAL_CAPSULE | Freq: Two times a day (BID) | ORAL | 0 refills | Status: DC

## 2023-11-13 MED ORDER — BACITRACIN ZINC 500 UNIT/GM EX OINT: 1.0000 | TOPICAL_OINTMENT | Freq: Two times a day (BID) | CUTANEOUS | 0 refills | Status: AC

## 2023-11-13 MED ORDER — CEPHALEXIN 500 MG PO CAPS: 500.0000 mg | ORAL_CAPSULE | Freq: Four times a day (QID) | ORAL | 0 refills | Status: AC

## 2023-11-13 NOTE — ED Triage Notes (Signed)
 Pt arrived via POV from Dr Celine [facility] for evaluation of possible cellulitis on superior aspect of her left foot.

## 2023-11-13 NOTE — ED Provider Notes (Signed)
 [place] EMERGENCY DEPARTMENT AT [facility] Provider Note   CSN: [id number] Arrival date & time: 11/13/23  1100     Patient presents with: Cellulitis   Misty Mann is a 78 y.o. female.   Patient is a 78 year old female who presents to emergency department from her podiatrist secondary to possible cellulitis to her left lower extremity.  Additional history is obtained by her daughter.  Is noted that she has had a wound to the anterior aspect of her left lower leg and has since developed redness.  Patient denies any pain at the site.  She has had no numbness or paresthesias.  She denies any fever or chills.  She has had some crusting over the site and some mild discharge.        Prior to Admission medications   Medication Sig Start Date End Date Taking? Authorizing Provider  albuterol  (VENTOLIN  HFA) 108 (90 Base) MCG/ACT inhaler Inhale 2 puffs into the lungs every 4 (four) hours as needed for wheezing or shortness of breath. 08/30/23   Darlean Ozell NOVAK, MD  ANORO ELLIPTA  62.5-25 MCG/ACT AEPB Inhale 1 puff into the lungs daily. 08/30/23   Darlean Ozell NOVAK, MD  buPROPion (WELLBUTRIN XL) 300 MG 24 hr tablet Take 300 mg by mouth daily.    [provider]  diphenhydrAMINE (BENADRYL) 25 MG tablet Take 25 mg by mouth every 6 (six) hours as needed for allergies.    [provider]  hydrocortisone cream 1 % Apply 1 application topically 2 (two) times daily as needed (eczema).    [provider]  irbesartan  (AVAPRO ) 150 MG tablet Take 1 tablet by mouth once daily 10/24/23   Wert, Michael B, MD  LORazepam  (ATIVAN ) 0.5 MG tablet Take 1 tablet (0.5 mg total) by mouth every 8 (eight) hours. Take 1 hour prior to radiation therapy 04/14/21   Shannon Agent, MD  montelukast (SINGULAIR) 10 MG tablet Take 10 mg by mouth at bedtime.    [provider]  naproxen sodium (ALEVE) 220 MG tablet Take 220 mg by mouth daily as needed (pain).    [provider]   omeprazole (PRILOSEC) 20 MG capsule Take 20 mg by mouth daily.    [provider]  PENICILLIN G SODIUM IJ Inject 1 Dose as directed every 30 (thirty) days.    [provider]  QUEtiapine (SEROQUEL) 200 MG tablet Take 200 mg by mouth at bedtime.    [provider]  rosuvastatin (CRESTOR) 40 MG tablet Take 40 mg by mouth daily.    [provider]  Vitamin D, Ergocalciferol, (DRISDOL) 1.25 MG (50000 UNIT) CAPS capsule Take 50,000 Units by mouth every Sunday.    [provider]    Allergies: Patient has no known allergies.    Review of Systems  Musculoskeletal:        Redness, swelling to left lower extremity  All other systems reviewed and are negative.   Updated Vital Signs BP (!) 154/96 (BP Location: Right Arm)   Pulse 75   Temp 97.8 F (36.6 C) (Oral)   Resp 16   Ht 5' (1.524 m)   Wt 67.6 kg   SpO2 95%   BMI 29.11 kg/m   Physical Exam Vitals and nursing note reviewed.  Constitutional:      General: She is not in acute distress.    Appearance: Normal appearance. She is not ill-appearing.  HENT:     Head: Normocephalic and atraumatic.     Nose: Nose  normal.     Mouth/Throat:     Mouth: Mucous membranes are moist.  Eyes:     Extraocular Movements: Extraocular movements intact.     Conjunctiva/sclera: Conjunctivae normal.     Pupils: Pupils are equal, round, and reactive to light.  Cardiovascular:     Rate and Rhythm: Normal rate and regular rhythm.     Pulses: Normal pulses.     Heart sounds: Normal heart sounds. No murmur heard.    No gallop.  Pulmonary:     Effort: Pulmonary effort is normal. No respiratory distress.     Breath sounds: Normal breath sounds. No stridor. No wheezing, rhonchi or rales.  Abdominal:     General: Abdomen is flat. Bowel sounds are normal. There is no distension.     Palpations: Abdomen is soft.     Tenderness: There is no abdominal tenderness. There is no guarding.  Musculoskeletal:         General: Normal range of motion.     Cervical back: Normal range of motion and neck supple.     Comments: Erythema, warmth noted to the left lower extremity from the mid to fib region to the foot, no areas of induration or fluctuance, edema noted to the left lower extremity diffusely, DP and PT pulses are 2+ distally, sensation intact distally, full range of motion noted throughout, area of crusted wound noted on the anterior aspect with no active discharge  Skin:    General: Skin is warm and dry.  Neurological:     General: No focal deficit present.     Mental Status: She is alert and oriented to person, place, and time. Mental status is at baseline.     Cranial Nerves: No cranial nerve deficit.     Sensory: No sensory deficit.     Motor: No weakness.     Coordination: Coordination normal.  Psychiatric:        Mood and Affect: Mood normal.        Behavior: Behavior normal.        Thought Content: Thought content normal.        Judgment: Judgment normal.     (all labs ordered are listed, but only abnormal results are displayed) Labs Reviewed  COMPREHENSIVE METABOLIC PANEL WITH GFR - Abnormal; Notable for the following components:      Result Value   Creatinine, Ser 1.07 (*)    GFR, Estimated 54 (*)    All other components within normal limits  URINALYSIS, W/ REFLEX TO CULTURE (INFECTION SUSPECTED) - Abnormal; Notable for the following components:   APPearance HAZY (*)    Protein, ur 100 (*)    Leukocytes,Ua MODERATE (*)    All other components within normal limits  CULTURE, BLOOD (SINGLE)  LACTIC ACID, PLASMA  CBC WITH DIFFERENTIAL/PLATELET    EKG: None  Radiology: No results found.   Procedures   Medications Ordered in the ED - No data to display                                  Medical Decision Making Amount and/or Complexity of Data Reviewed Labs: ordered. Radiology: ordered.  Risk OTC drugs. Prescription drug management.   This patient presents to the  ED for concern of redness, swelling to left lower extremity differential diagnosis includes cellulitis, abscess, necrotizing fasciitis, osteomyelitis, PAD, DVT    Additional history obtained:  Additional history obtained from daughter External  records from outside source obtained and reviewed including medical records   Lab Tests:  I Ordered, and personally interpreted labs.  The pertinent results include: No leukocytosis, no anemia, normal kidney function liver function, unremarkable electrolytes, negative lactic acid, unremarkable urinalysis   Imaging Studies ordered:  I ordered imaging studies including venous duplex of left lower extremity, x-ray of left ankle I independently visualized and interpreted imaging which showed no DVT, no acute osseous injury or lesions, edema of the left lower extremity I agree with the radiologist interpretation   Problem List / ED Course:  Patient is doing well at this time and is stable for discharge home.  Discussed with patient and daughter that we will treat her with antibiotics at this point given her apparent cellulitis.  Patient has no indication for abscess relation and no indication for necrotizing fasciitis.  She has no indication for sepsis at this point.  Venous duplex was negative for DVT.  She did have strong peripheral pulses with no indication for acute arterial occlusion.  Patient has stable vital signs and no indication for sepsis at this time.  Do not suspect that admission is warranted.  Close follow-up with PCP was discussed for recheck as well as strict turn precautions for any new or worsening symptoms.  Patient voiced understanding and had no additional questions.   Social Determinants of Health:  None        Final diagnoses:  None    ED Discharge Orders     None          Daralene Lonni JONETTA DEVONNA 11/13/23 1406    Bernard Drivers, MD 11/15/23 1446

## 2023-11-13 NOTE — Discharge Instructions (Signed)
 Please take all antibiotics as directed.  Follow-up closely with your primary care doctor on an outpatient basis for a recheck.  Return to emergency department immediately for any new or worsening symptoms.

## 2023-11-18 LAB — CULTURE, BLOOD (SINGLE): Culture: NO GROWTH

## 2023-12-02 ENCOUNTER — Encounter (HOSPITAL_COMMUNITY): Payer: Self-pay

## 2023-12-02 ENCOUNTER — Emergency Department (HOSPITAL_COMMUNITY)

## 2023-12-02 ENCOUNTER — Emergency Department (HOSPITAL_COMMUNITY)
Admission: EM | Admit: 2023-12-02 | Discharge: 2023-12-02 | Disposition: A | Discharge status: Home / Self Care | Attending: Emergency Medicine | Admitting: Emergency Medicine

## 2023-12-02 DIAGNOSIS — M79672 Pain in left foot: Secondary | ICD-10-CM | POA: Diagnosis present

## 2023-12-02 DIAGNOSIS — J449 Chronic obstructive pulmonary disease, unspecified: Secondary | ICD-10-CM | POA: Diagnosis not present

## 2023-12-02 DIAGNOSIS — L03116 Cellulitis of left lower limb: Secondary | ICD-10-CM | POA: Diagnosis not present

## 2023-12-02 DIAGNOSIS — I10 Essential (primary) hypertension: Secondary | ICD-10-CM | POA: Diagnosis not present

## 2023-12-02 HISTORY — DX: Cutaneous abscess of unspecified foot: L02.619

## 2023-12-02 LAB — CBC WITH DIFFERENTIAL/PLATELET
Abs Immature Granulocytes: 0.02 K/uL (ref 0.00–0.07)
Basophils Absolute: 0 K/uL (ref 0.0–0.1)
Basophils Relative: 1 %
Eosinophils Absolute: 0.3 K/uL (ref 0.0–0.5)
Eosinophils Relative: 5 %
HCT: 42.2 % (ref 36.0–46.0)
Hemoglobin: 13.5 g/dL (ref 12.0–15.0)
Immature Granulocytes: 0 %
Lymphocytes Relative: 16 %
Lymphs Abs: 1 K/uL (ref 0.7–4.0)
MCH: 30.8 pg (ref 26.0–34.0)
MCHC: 32 g/dL (ref 30.0–36.0)
MCV: 96.3 fL (ref 80.0–100.0)
Monocytes Absolute: 0.4 K/uL (ref 0.1–1.0)
Monocytes Relative: 6 %
Neutro Abs: 4.6 K/uL (ref 1.7–7.7)
Neutrophils Relative %: 72 %
Platelets: 210 K/uL (ref 150–400)
RBC: 4.38 MIL/uL (ref 3.87–5.11)
RDW: 13.6 % (ref 11.5–15.5)
WBC: 6.3 K/uL (ref 4.0–10.5)
nRBC: 0 % (ref 0.0–0.2)

## 2023-12-02 LAB — I-STAT CHEM 8, ED
BUN: 20 mg/dL (ref 8–23)
Calcium, Ion: 1.05 mmol/L — ABNORMAL LOW (ref 1.15–1.40)
Chloride: 107 mmol/L (ref 98–111)
Creatinine, Ser: 1.3 mg/dL — ABNORMAL HIGH (ref 0.44–1.00)
Glucose, Bld: 104 mg/dL — ABNORMAL HIGH (ref 70–99)
HCT: 40 % (ref 36.0–46.0)
Hemoglobin: 13.6 g/dL (ref 12.0–15.0)
Potassium: 4.5 mmol/L (ref 3.5–5.1)
Sodium: 140 mmol/L (ref 135–145)
TCO2: 24 mmol/L (ref 22–32)

## 2023-12-02 LAB — COMPREHENSIVE METABOLIC PANEL WITH GFR
ALT: 13 U/L (ref 0–44)
AST: 19 U/L (ref 15–41)
Albumin: 3.5 g/dL (ref 3.5–5.0)
Alkaline Phosphatase: 86 U/L (ref 38–126)
Anion gap: 12 (ref 5–15)
BUN: 16 mg/dL (ref 8–23)
CO2: 24 mmol/L (ref 22–32)
Calcium: 8.9 mg/dL (ref 8.9–10.3)
Chloride: 106 mmol/L (ref 98–111)
Creatinine, Ser: 1.32 mg/dL — ABNORMAL HIGH (ref 0.44–1.00)
GFR, Estimated: 42 mL/min — ABNORMAL LOW (ref >60)
Glucose, Bld: 109 mg/dL — ABNORMAL HIGH (ref 70–99)
Potassium: 4.7 mmol/L (ref 3.5–5.1)
Sodium: 142 mmol/L (ref 135–145)
Total Bilirubin: 0.8 mg/dL (ref 0.0–1.2)
Total Protein: 6.8 g/dL (ref 6.5–8.1)

## 2023-12-02 MED ORDER — SODIUM CHLORIDE 0.9 % IV SOLN
1.0000 g | Freq: Once | INTRAVENOUS | Status: AC
  Administered 2023-12-02: 1 g via INTRAVENOUS
  Filled 2023-12-02: qty 10

## 2023-12-02 MED ORDER — DOXYCYCLINE HYCLATE 100 MG PO TABS
100.0000 mg | ORAL_TABLET | Freq: Once | ORAL | Status: AC
  Administered 2023-12-02: 100 mg via ORAL
  Filled 2023-12-02: qty 1

## 2023-12-02 MED ORDER — DOXYCYCLINE HYCLATE 100 MG PO CAPS: 100.0000 mg | ORAL_CAPSULE | Freq: Two times a day (BID) | ORAL | 0 refills | Status: AC

## 2023-12-02 MED ORDER — CEPHALEXIN 500 MG PO CAPS: 500.0000 mg | ORAL_CAPSULE | Freq: Four times a day (QID) | ORAL | 0 refills | Status: AC

## 2023-12-02 NOTE — ED Triage Notes (Signed)
 Pt comes in for left foot pain. Pt has been dx with cellulitis. Pt was given abx, which are completed but the pain is not any better. Pt does have a weeping (clear fluid) on the top of the foot. Pt is not sure if she needs wound care or not. A&Ox4. Pt is able to walk on foot.

## 2023-12-02 NOTE — Discharge Instructions (Addendum)
 Keep leg elevated.  Continue home wound care. Ask your Physician for referral to the wound care clinic in [place]

## 2023-12-02 NOTE — ED Provider Notes (Signed)
 [place] EMERGENCY DEPARTMENT AT [facility] Provider Note   CSN: [id number] Arrival date & time: 12/02/23  1157     Patient presents with: Foot Pain   Misty Mann is a 78 y.o. female.   Patient complains of redness to her left lower foot and ankle.  Patient's daughter reports that patient was treated here on 10/27 for cellulitis.  Patient was treated with 2 antibiotics.  Patient's daughter states that the wound had almost completely healed but has began to turn red again after stopping the antibiotics.  Patient has had some drainage from the front of her leg today.  Patient has not had any fever or chills she has not had any nausea or vomiting.  Patient has a past medical history of COPD and hypertension.  She is not allergic to any medications  The history is provided by the patient and a relative. No language interpreter was used.  Foot Pain This is a new problem. The current episode started more than 2 days ago. Nothing aggravates the symptoms. Nothing relieves the symptoms. She has tried nothing for the symptoms. The treatment provided moderate relief.       Prior to Admission medications   Medication Sig Start Date End Date Taking? Authorizing Provider  cephALEXin (KEFLEX) 500 MG capsule Take 1 capsule (500 mg total) by mouth 4 (four) times daily. 12/02/23  Yes Kemya Shed K, PA-C  doxycycline (VIBRAMYCIN) 100 MG capsule Take 1 capsule (100 mg total) by mouth 2 (two) times daily. 12/02/23  Yes Flint Sonny POUR, PA-C  albuterol  (VENTOLIN  HFA) 108 (90 Base) MCG/ACT inhaler Inhale 2 puffs into the lungs every 4 (four) hours as needed for wheezing or shortness of breath. 08/30/23   Darlean Ozell NOVAK, MD  ANORO ELLIPTA  62.5-25 MCG/ACT AEPB Inhale 1 puff into the lungs daily. 08/30/23   Wert, Michael B, MD  bacitracin ointment Apply 1 Application topically 2 (two) times daily. 11/13/23   Daralene Lonni BIRCH, PA-C  buPROPion (WELLBUTRIN XL) 300 MG 24 hr tablet Take 300 mg by  mouth daily.    [provider]  diphenhydrAMINE (BENADRYL) 25 MG tablet Take 25 mg by mouth every 6 (six) hours as needed for allergies.    [provider]  hydrocortisone cream 1 % Apply 1 application topically 2 (two) times daily as needed (eczema).    [provider]  irbesartan  (AVAPRO ) 150 MG tablet Take 1 tablet by mouth once daily 10/24/23   Wert, Michael B, MD  LORazepam  (ATIVAN ) 0.5 MG tablet Take 1 tablet (0.5 mg total) by mouth every 8 (eight) hours. Take 1 hour prior to radiation therapy 04/14/21   Shannon Agent, MD  montelukast (SINGULAIR) 10 MG tablet Take 10 mg by mouth at bedtime.    [provider]  naproxen sodium (ALEVE) 220 MG tablet Take 220 mg by mouth daily as needed (pain).    [provider]  omeprazole (PRILOSEC) 20 MG capsule Take 20 mg by mouth daily.    [provider]  PENICILLIN G SODIUM IJ Inject 1 Dose as directed every 30 (thirty) days.    [provider]  QUEtiapine (SEROQUEL) 200 MG tablet Take 200 mg by mouth at bedtime.    [provider]  rosuvastatin (CRESTOR) 40 MG tablet Take 40 mg by mouth daily.    [provider]  Vitamin D, Ergocalciferol, (DRISDOL) 1.25 MG (50000 UNIT) CAPS capsule Take 50,000 Units by mouth every Sunday.    [provider]  Allergies: Patient has no known allergies.    Review of Systems  Skin:  Positive for wound.  All other systems reviewed and are negative.   Updated Vital Signs BP (!) 118/90   Pulse 69   Temp 98 F (36.7 C) (Oral)   Resp 18   Ht 5' (1.524 m)   Wt 67.1 kg   SpO2 93%   BMI 28.90 kg/m   Physical Exam Vitals reviewed.  Constitutional:      Appearance: Normal appearance.  Cardiovascular:     Rate and Rhythm: Normal rate.  Pulmonary:     Effort: Pulmonary effort is normal.  Abdominal:     General: Abdomen is flat.  Musculoskeletal:        General: Normal range of motion.     Comments: Redness left  anterior leg from distal third of shin to foot.  Equal pulses bilaterally.  Normal cap refill, full range of motion neurosensory intact  Skin:    General: Skin is warm.  Neurological:     General: No focal deficit present.     Mental Status: She is alert.     (all labs ordered are listed, but only abnormal results are displayed) Labs Reviewed  COMPREHENSIVE METABOLIC PANEL WITH GFR - Abnormal; Notable for the following components:      Result Value   Glucose, Bld 109 (*)    Creatinine, Ser 1.32 (*)    GFR, Estimated 42 (*)    All other components within normal limits  I-STAT CHEM 8, ED - Abnormal; Notable for the following components:   Creatinine, Ser 1.30 (*)    Glucose, Bld 104 (*)    Calcium, Ion 1.05 (*)    All other components within normal limits  CBC WITH DIFFERENTIAL/PLATELET    EKG: None  Radiology: DG Foot Complete Left Result Date: 12/02/2023 CLINICAL DATA:  Left rib pain. EXAM: LEFT FOOT - COMPLETE 3+ VIEW COMPARISON:  None Available. FINDINGS: There is no evidence of fracture or dislocation. There is no evidence of arthropathy or other focal bone abnormality. Soft tissues are unremarkable. IMPRESSION: Negative. Electronically Signed   By: Camellia Candle M.D.   On: 12/02/2023 12:51     Procedures   Medications Ordered in the ED  doxycycline (VIBRA-TABS) tablet 100 mg (has no administration in time range)  cefTRIAXone (ROCEPHIN) 1 g in sodium chloride 0.9 % 100 mL IVPB (0 g Intravenous Stopped 12/02/23 1637)                                    Medical Decision Making Patient complains of an infection to her left leg.  Patient was recently treated for this and was getting better while taking antibiotics.  Amount and/or Complexity of Data Reviewed Labs: ordered.    Details: Labs ordered reviewed and interpreted patient has a normal white blood cell count Radiology: ordered and independent interpretation performed. Decision-making details documented in ED  Course.    Details: X-ray left foot shows no acute findings  Risk Prescription drug management. Risk Details: Patient given Rocephin 1 g IV.  Doxycycline 100 mg p.o.  Patient is given a prescription for Keflex and doxycycline.  Patient advised to call her primary care physician and downfall with assistance with scheduling with the wound care clinic for ongoing care.  Patient is advised to return to the emergency department if symptoms worsen or change.  Final diagnoses:  Cellulitis of left lower leg    ED Discharge Orders          Ordered    doxycycline (VIBRAMYCIN) 100 MG capsule  2 times daily        12/02/23 1753    cephALEXin (KEFLEX) 500 MG capsule  4 times daily        12/02/23 1753            An After Visit Summary was printed and given to the patient.    Flint Sonny POUR, PA-C 12/02/23 1800    Towana Ozell BROCKS, MD 12/03/23 [phone]

## 2023-12-02 NOTE — ED Notes (Signed)
 Pt ambulated to the bathroom in NAD and without assistance.
